# Patient Record
Sex: Male | Born: 1995 | Race: Black or African American | Hispanic: No | Marital: Single | Smoking: Never smoker
Health system: Southern US, Community
[De-identification: ages and names within clinical notes are randomized; demographics above are authoritative.]

## PROBLEM LIST (undated history)

## (undated) DIAGNOSIS — K219 Gastro-esophageal reflux disease without esophagitis: Secondary | ICD-10-CM

## (undated) DIAGNOSIS — A64 Unspecified sexually transmitted disease: Secondary | ICD-10-CM

## (undated) DIAGNOSIS — F909 Attention-deficit hyperactivity disorder, unspecified type: Secondary | ICD-10-CM

## (undated) DIAGNOSIS — F913 Oppositional defiant disorder: Secondary | ICD-10-CM

## (undated) DIAGNOSIS — F319 Bipolar disorder, unspecified: Secondary | ICD-10-CM

## (undated) DIAGNOSIS — J45909 Unspecified asthma, uncomplicated: Secondary | ICD-10-CM

## (undated) DIAGNOSIS — R4689 Other symptoms and signs involving appearance and behavior: Secondary | ICD-10-CM

## (undated) HISTORY — PX: KIDNEY SURGERY: SHX687

## (undated) HISTORY — DX: Gastro-esophageal reflux disease without esophagitis: K21.9

---
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
## 2016-02-16 ENCOUNTER — Other Ambulatory Visit (HOSPITAL_COMMUNITY)
Admission: RE | Admit: 2016-02-16 | Discharge: 2016-02-16 | Disposition: A | Discharge status: Home / Self Care | Payer: BLUE CROSS/BLUE SHIELD | Source: Ambulatory Visit | Attending: Family Medicine | Admitting: Family Medicine

## 2016-02-16 ENCOUNTER — Encounter: Payer: Self-pay | Admitting: Student in an Organized Health Care Education/Training Program

## 2016-02-16 ENCOUNTER — Ambulatory Visit (INDEPENDENT_AMBULATORY_CARE_PROVIDER_SITE_OTHER): Payer: BLUE CROSS/BLUE SHIELD | Admitting: Student in an Organized Health Care Education/Training Program

## 2016-02-16 VITALS — BP 110/80 | HR 105 | Temp 97.8°F | Wt 227.0 lb

## 2016-02-16 DIAGNOSIS — Z113 Encounter for screening for infections with a predominantly sexual mode of transmission: Secondary | ICD-10-CM | POA: Diagnosis not present

## 2016-02-16 DIAGNOSIS — F901 Attention-deficit hyperactivity disorder, predominantly hyperactive type: Secondary | ICD-10-CM

## 2016-02-16 MED ORDER — DEXMETHYLPHENIDATE HCL 10 MG PO TABS: 10.0000 mg | ORAL_TABLET | Freq: Every day | ORAL | 0 refills | Status: DC | PRN

## 2016-02-16 NOTE — Assessment & Plan Note (Signed)
-   ordered GC/Chlamydia, HIV, RPR - will f/u with results

## 2016-02-16 NOTE — Progress Notes (Signed)
   CC: ADHD refill, STI check  HPI: Caleb Bartlett is a 21 y.o. male with history of asthma, bipolar disorder, oppositional defiant disorder, and ADHD with hyperactivity who [facility] today for medication refill.  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   CC, SH/smoking status, and VS noted.  Objective: BP 110/80 (BP Location: Left Arm, Patient Position: Sitting, Cuff Size: Normal)   Pulse (!) 105   Temp 97.8 F (36.6 C) (Oral)   Wt 227 lb (103 kg)   SpO2 90%  GEN: NAD, alert, cooperative, and pleasant. RESPIRATORY: clear to auscultation bilaterally with no wheezes, rhonchi or rales, good effort CV: RRR, no m/r/g, no peripheral edema GI: soft, non-tender, non-distended, normoactive bowel sounds, no hepatosplenomegaly SKIN: warm and dry, no rashes or lesions NEURO: II-XII grossly intact, normal gait, peripheral sensation intact PSYCH: AAOx3, appropriate affect  Flu Vaccine: Declines  Assessment and plan:  Attention deficit hyperactivity disorder (ADHD) - Restarted dexmethylphenidate at 10 mg daily, reduced dose from 20 mg due to being off the medication x1 year - asked patient to make f/u appt for medication monitoring and refill in one month, discussed how controlled substances have to be refilled in the office and not over the phone - Patient may be able to stay at this dose for work, can titrate up as needed - continue to monitor for changes  in appetite, dizziness, insomnia/nightmares, mood lability, tics or psychosis, and vitals at each visit    Routine screening for STI (sexually transmitted infection) - ordered GC/Chlamydia, HIV, RPR - will f/u with results   Orders Placed This Encounter  Procedures  . HIV antibody  . RPR    Add on if possible  - Urine GC/Chla  Meds ordered this encounter  Medications  . dexmethylphenidate (FOCALIN) 10 MG tablet    Sig: Take 1 tablet (10 mg total) by mouth daily as needed.    Dispense:  30 tablet    Refill:  0     Caleb PouchLauren Jerred Zaremba, MD,MS,  PGY1 02/16/2016 5:27 PM

## 2016-02-16 NOTE — Patient Instructions (Signed)
It was a pleasure seeing you today in our clinic. Today we discussed ADHD and STI screening. Here is the treatment plan we have discussed and agreed upon together:  - We will contact you with the results of your STI testing. - Please follow up in one month for ADHD monitoring and medication refill.  Our clinic's number is [phone]. Please call with questions or concerns about what we discussed today.  Be well, Dr. Mosetta PuttFeng

## 2016-02-16 NOTE — Assessment & Plan Note (Addendum)
-   Restarted dexmethylphenidate at 10 mg daily, reduced dose from 20 mg due to being off the medication x1 year - asked patient to make f/u appt for medication monitoring and refill in one month, discussed how controlled substances have to be refilled in the office and not over the phone - Patient may be able to stay at this dose for work, can titrate up as needed - continue to monitor for changes in appetite, dizziness, insomnia/nightmares, mood lability, tics or psychosis, and vitals at each visit

## 2016-02-17 LAB — HIV ANTIBODY (ROUTINE TESTING W REFLEX): HIV 1&2 Ab, 4th Generation: NONREACTIVE

## 2016-02-22 LAB — URINE CYTOLOGY ANCILLARY ONLY
CHLAMYDIA, DNA PROBE: NEGATIVE
NEISSERIA GONORRHEA: NEGATIVE

## 2016-02-23 ENCOUNTER — Telehealth: Payer: Self-pay | Admitting: Student in an Organized Health Care Education/Training Program

## 2016-02-23 ENCOUNTER — Encounter: Payer: Self-pay | Admitting: Student in an Organized Health Care Education/Training Program

## 2016-02-23 NOTE — Telephone Encounter (Signed)
Called to inform patients of STI results - HIV, GC/Chla all negative.  Patient voiced understanding.

## 2016-03-21 NOTE — Progress Notes (Deleted)
   CC: ***  HPI: Caleb Bartlett is a 21 y.o. male with PMH significant for *** [facility] *** of *** duration.   - ***changes in appetite -***dizziness -***insomnia/nightmares -***mood lability -***tics or psychosis      Overdue health maintenance ***  Review of Symptoms:  See HPI for ROS.   CC, SH/smoking status, and VS noted.  Objective: There were no vitals taken for this visit. GEN: NAD, alert, cooperative, and pleasant.*** EYE: no conjunctival injection, pupils equally round and reactive to light ENMT: normal tympanic light reflex, no nasal polyps,no rhinorrhea, no pharyngeal erythema or exudates NECK: full ROM, no thyromegaly RESPIRATORY: clear to auscultation bilaterally with no wheezes, rhonchi or rales, good effort CV: RRR, no m/r/g, no peripheral edema GI: soft, non-tender, non-distended, normoactive bowel sounds, no hepatosplenomegaly SKIN: warm and dry, no rashes or lesions NEURO: II-XII grossly intact, normal gait, peripheral sensation intact PSYCH: AAOx3, appropriate affect  Flu Vaccine: *** Tdap Vaccine: *** - every 652yrs - (<3 lifetime doses or unknown): all wounds -- look up need for Tetanus IG - (>=3 lifetime doses): clean/minor wound if >652yrs from previous; all other wounds if >7926yrs from previous Zoster Vaccine: *** (those >50yo, once) Pneumonia Vaccine: *** (those w/ risk factors) - (<8522yr) Both: Immunocompromised, cochlear implant, CSF leak, asplenic, sickle cell, Chronic Renal Failure - (<3922yr) PPSV-23 only: Heart dz, lung disease, DM, tobacco abuse, alcoholism, cirrhosis/liver disease. - (>6222yr): PPSV13 then PPSV23 in 6-12mths;  - (>5122yr): repeat PPSV23 once if pt received prior to 20yo and 126yrs have passed  Assessment and plan:  No problem-specific Assessment & Plan notes found for this encounter.   No orders of the defined types were placed in this encounter.   No orders of the defined types were placed in  this encounter.    Howard PouchLauren Sladen Plancarte, MD,MS,  PGY1 03/21/2016 10:22 PM

## 2016-03-22 ENCOUNTER — Ambulatory Visit: Payer: BLUE CROSS/BLUE SHIELD | Admitting: Student in an Organized Health Care Education/Training Program

## 2016-10-04 ENCOUNTER — Encounter: Payer: BLUE CROSS/BLUE SHIELD | Admitting: Student in an Organized Health Care Education/Training Program

## 2016-10-04 NOTE — Progress Notes (Deleted)
   CC: ***  HPI: Claudette HeadJeremiah K Brown-Derr is a 21 y.o. male with PMH significant for *** who [facility] with *** of *** duration.   Overdue health maintenance ***  Review of Symptoms:  See HPI for ROS.   CC, SH/smoking status, and VS noted.  Objective: There were no vitals taken for this visit. GEN: NAD, alert, cooperative, and pleasant.*** EYE: no conjunctival injection, pupils equally round and reactive to light ENMT: normal tympanic light reflex, no nasal polyps,no rhinorrhea, no pharyngeal erythema or exudates NECK: full ROM, no thyromegaly RESPIRATORY: clear to auscultation bilaterally with no wheezes, rhonchi or rales, good effort CV: RRR, no m/r/g, no peripheral edema GI: soft, non-tender, non-distended, no hepatosplenomegaly SKIN: warm and dry, no rashes or lesions NEURO: II-XII grossly intact, normal gait, peripheral sensation intact PSYCH: AAOx3, appropriate affect  Flu Vaccine: *** Tdap Vaccine: *** - every 3648yrs - (<3 lifetime doses or unknown): all wounds -- look up need for Tetanus IG - (>=3 lifetime doses): clean/minor wound if >6848yrs from previous; all other wounds if >1877yrs from previous Zoster Vaccine: *** (those >50yo, once) Pneumonia Vaccine: *** (those w/ risk factors) - (<1654yr) Both: Immunocompromised, cochlear implant, CSF leak, asplenic, sickle cell, Chronic Renal Failure - (<4054yr) PPSV-23 only: Heart dz, lung disease, DM, tobacco abuse, alcoholism, cirrhosis/liver disease. - (>5454yr): PPSV13 then PPSV23 in 6-12mths;  - (>6254yr): repeat PPSV23 once if pt received prior to 20yo and 4677yrs have passed  Assessment and plan:  No problem-specific Assessment & Plan notes found for this encounter.   No orders of the defined types were placed in this encounter.   No orders of the defined types were placed in this encounter.    Howard PouchLauren Talbert Trembath, MD,MS,  PGY2 10/04/2016 6:06 AM

## 2016-10-27 ENCOUNTER — Ambulatory Visit (HOSPITAL_COMMUNITY)
Admission: EM | Admit: 2016-10-27 | Discharge: 2016-10-27 | Disposition: A | Discharge status: Home / Self Care | Payer: Self-pay | Attending: Emergency Medicine | Admitting: Emergency Medicine

## 2016-10-27 ENCOUNTER — Encounter (HOSPITAL_COMMUNITY): Payer: Self-pay | Admitting: Emergency Medicine

## 2016-10-27 DIAGNOSIS — R51 Headache: Secondary | ICD-10-CM

## 2016-10-27 DIAGNOSIS — G501 Atypical facial pain: Secondary | ICD-10-CM

## 2016-10-27 DIAGNOSIS — R519 Headache, unspecified: Secondary | ICD-10-CM
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
## 2016-10-27 NOTE — Discharge Instructions (Signed)
It appears you have maybe bit the inner buccal area to rt cheek no bleeding no breaking of the skin.  Use tylenol or motrin minimal for pain .  Use heat or ice as needed If you have any blurred vision or headaches go to the er for a x ray or scan

## 2016-11-04 ENCOUNTER — Encounter: Payer: BLUE CROSS/BLUE SHIELD | Admitting: Student in an Organized Health Care Education/Training Program

## 2017-02-03 ENCOUNTER — Other Ambulatory Visit: Payer: Self-pay

## 2017-02-03 ENCOUNTER — Encounter: Payer: Self-pay | Admitting: Student in an Organized Health Care Education/Training Program

## 2017-02-03 ENCOUNTER — Ambulatory Visit (INDEPENDENT_AMBULATORY_CARE_PROVIDER_SITE_OTHER): Payer: Self-pay | Admitting: Student in an Organized Health Care Education/Training Program

## 2017-02-03 VITALS — BP 110/82 | HR 89 | Temp 98.1°F | Ht 68.0 in | Wt 232.6 lb

## 2017-02-03 DIAGNOSIS — Z113 Encounter for screening for infections with a predominantly sexual mode of transmission: Secondary | ICD-10-CM

## 2017-02-03 DIAGNOSIS — F909 Attention-deficit hyperactivity disorder, unspecified type: Secondary | ICD-10-CM

## 2017-02-03 MED ORDER — DEXMETHYLPHENIDATE HCL 10 MG PO TABS: 10.0000 mg | ORAL_TABLET | Freq: Every day | ORAL | 0 refills | Status: DC | PRN

## 2017-02-03 NOTE — Patient Instructions (Signed)
It was a pleasure seeing you today in our clinic. Today we discussed STI screening and ADHD. Here is the treatment plan we have discussed and agreed upon together:  Please schedule a visit for one month from now to refill your ADHD medications. I will not be able to refill them unless you follow up, because this is a controlled medication that must be monitored.  We drew blood work at today's visit. I will call or send you a letter with these results. If you do not hear from me within the next week, please give our office a call.  You can come back anytime for a lab visit for us to collect urine.  Our clinic's number is [phone]. Please call with questions or concerns about what we discussed today.  Be well, Dr. Mosetta PuttFeng

## 2017-02-03 NOTE — Progress Notes (Signed)
   CC: ADHD, STI screen  HPI: Caleb Bartlett is a 22 y.o. male with PMH significant for ADHD, bipolar disorder,oppositional defiant disorder, asthma who [facility] or ADHD follow-up and STI check.    ADHD Patient was seen about a year ago at which time he stated he was starting a new job and would like a refill on ADHD medications, Focalin 10 mg which he had been on previously, but had been off of for a while. Patient states he is now starting college classes and is concerned about ADHD interfering with his ability to focus with schoolwork. He states that he was able to focus better for that month that he had focalin last year. Upon chart review I can see that the patient has been treated for ADHD since before the age of 22.   STI screen - patient requests routine STI screening - He reports that he is sexually active and uses condoms every time for protection -He denies noticing any symptoms, no rashes or lesions, no urinary symptoms such as dysuria, urinary frequency, urinary urgency. No GI symptoms such as constipation or diarrhea.  Review of Symptoms:  See HPI for ROS.   CC, SH/smoking status, and VS noted.  Objective: BP 110/82   Pulse 89   Temp 98.1 F (36.7 C) (Oral)   Ht 5\' 8"  (1.727 m)   Wt 232 lb 9.6 oz (105.5 kg)   SpO2 98%   BMI 35.37 kg/m  GEN: NAD, alert, cooperative, and pleasant. RESPIRATORY: clear to auscultation bilaterally with no wheezes, rhonchi or rales, good effort CV: RRR, no m/r/g GI: soft, non-tender, non-distended SKIN: warm and dry, no rashes or lesions NEURO: II-XII grossly intact, normal gait PSYCH: AAOx3, appropriate affect  Assessment and plan:  Attention deficit hyperactivity disorder (ADHD) Discussed necessity of follow up and monitoring for controlled substances. Patient voices understanding. It is reasonable to refill focalin for him to start college - follow up in one month - patient understands, no refills without follow  up  Routine screening for STI (sexually transmitted infection) Patient requests STI screening. Discussed safe sex practices. - HIV, RPR today - unable to collect urine GC/Chla due to patient urinating right before the visit, however we can do at next visit or as lab visit. He is asymptomatic and it is routine screening   Orders Placed This Encounter  Procedures  . RPR  . HIV antibody    Meds ordered this encounter  Medications  . dexmethylphenidate (FOCALIN) 10 MG tablet    Sig: Take 1 tablet (10 mg total) by mouth daily as needed.    Dispense:  30 tablet    Refill:  0   Howard PouchLauren Demia Viera, MD,MS,  PGY2 02/08/2017 7:26 AM

## 2017-02-04 ENCOUNTER — Encounter (HOSPITAL_BASED_OUTPATIENT_CLINIC_OR_DEPARTMENT_OTHER): Payer: Self-pay | Admitting: Student in an Organized Health Care Education/Training Program

## 2017-02-04 LAB — RPR: RPR Ser Ql: NONREACTIVE

## 2017-02-04 LAB — HIV ANTIBODY (ROUTINE TESTING W REFLEX): HIV Screen 4th Generation wRfx: NONREACTIVE

## 2017-02-07 ENCOUNTER — Encounter: Payer: Self-pay | Admitting: Student in an Organized Health Care Education/Training Program

## 2017-02-08 ENCOUNTER — Encounter: Payer: Self-pay | Admitting: Student in an Organized Health Care Education/Training Program

## 2017-02-08 NOTE — Assessment & Plan Note (Signed)
Discussed necessity of follow up and monitoring for controlled substances. Patient voices understanding. It is reasonable to refill focalin for him to start college - follow up in one month - patient understands, no refills without follow up

## 2017-02-08 NOTE — Assessment & Plan Note (Addendum)
Patient requests STI screening. Discussed safe sex practices. - HIV, RPR today - unable to collect urine GC/Chla due to patient urinating right before the visit, however we can do at next visit or as lab visit. He is asymptomatic and it is routine screening

## 2017-04-19 ENCOUNTER — Encounter: Payer: Self-pay | Admitting: Student in an Organized Health Care Education/Training Program

## 2017-04-19 ENCOUNTER — Ambulatory Visit: Payer: Managed Care, Other (non HMO) | Admitting: Student in an Organized Health Care Education/Training Program

## 2017-04-19 ENCOUNTER — Other Ambulatory Visit: Payer: Self-pay

## 2017-04-19 ENCOUNTER — Other Ambulatory Visit (HOSPITAL_COMMUNITY)
Admission: RE | Admit: 2017-04-19 | Discharge: 2017-04-19 | Disposition: A | Discharge status: Home / Self Care | Payer: Managed Care, Other (non HMO) | Source: Ambulatory Visit | Attending: Family Medicine | Admitting: Family Medicine

## 2017-04-19 VITALS — BP 110/66 | HR 91 | Temp 97.3°F | Wt 231.0 lb

## 2017-04-19 DIAGNOSIS — Z113 Encounter for screening for infections with a predominantly sexual mode of transmission: Secondary | ICD-10-CM

## 2017-04-19 DIAGNOSIS — F319 Bipolar disorder, unspecified: Secondary | ICD-10-CM | POA: Diagnosis not present

## 2017-04-19 DIAGNOSIS — F909 Attention-deficit hyperactivity disorder, unspecified type: Secondary | ICD-10-CM | POA: Diagnosis not present

## 2017-04-19 NOTE — Assessment & Plan Note (Signed)
Patient denies having new sexual partners but does ask for "all" STI screening.  - HIV, RPR - GC/Chla - will follow up with results - safe sexual practices reviewed

## 2017-04-19 NOTE — Progress Notes (Signed)
   CC: ADHD follow up, STI  HPI: Caleb Bartlett is a 22 y.o. male with PMH significant for ADHD, bipolar, oppositional defiant disorder [facility].  ADHD/bipolar Feels he is too jittery, too high energy. Doing well in school, has 2 B's, one C, and an A. He has been out of focalin since before his last visit on 02/03/2017, was unable to fill it due to insurance not covering this medication. He reports that he has also been out of Abilify since last year. He is working at [employer] and doing well in his job. He reports that he felt an anger flare coming yesterday at work and had to calm himself down to prevent from lashing out. He asks for referral to psychiatry for possible Abilify refill.   STI screening No new sexual partners. No sores or lesions. No pain with urination.  Review of Symptoms:  See HPI for ROS.   CC, SH/smoking status, and VS noted.  Objective: BP 110/66   Pulse 91   Temp (!) 97.3 F (36.3 C) (Oral)   Wt 231 lb (104.8 kg)   SpO2 97%   BMI 35.12 kg/m  GEN: NAD, alert, cooperative, and pleasant. RESPIRATORY: comfortable work of breathing CV: RRR,no peripheral edema GI: soft, non-tender, non-distended, no hepatosplenomegaly PSYCH: AAOx3, appropriate affect  Assessment and plan:  Routine screening for STI (sexually transmitted infection) Patient denies having new sexual partners but does ask for "all" STI screening.  - HIV, RPR - GC/Chla - will follow up with results - safe sexual practices reviewed  BIPOLAR DISORDER UNSPECIFIED Patient has good insight into his mood, and is able to use calming strategies to keep from lashing out when he becomes angry. He does have some increased energy, jitteriness. No SI/HI. He was given a list of psychiatrists in the area and asked to coordinate through his insurance to find one that is covered. He can call and make an appointment.  Also gave him information regarding [facility]. He may be a  good candidate for CBT/strategies to manage his symptoms if he prefers to be off of medications, given his insight and ability to manage his mood disorder off of medications over the last year.  Attention deficit hyperactivity disorder (ADHD) Patient is functioning well. He has good grades and is doing well at work without ADHD Meds. Shared decision making, we will not order ADHD meds at this time and have him follow up with psychiatry for bipolar evaluation. It may be that with bipolar under control he does not require ADHD meds at all.  Down the road if he does need to go back on ADHD meds, note that focalin was not covered by insurance and he has tolerated Aderall in the past.   Orders Placed This Encounter  Procedures  . HIV antibody  . RPR    Howard PouchLauren Amylia Collazos, MD,MS,  PGY2 04/19/2017 4:41 PM

## 2017-04-19 NOTE — Patient Instructions (Signed)
It was a pleasure seeing you today in our clinic. Here is the treatment plan we have discussed and agreed upon together:  We drew blood work at today's visit. I will call or send you a letter with these results. If you do not hear from me within the next week, please give our office a call.  Our clinic's number is [phone]. Please call with questions or concerns about what we discussed today.  Be well, Dr. Mosetta PuttFeng  Sign up for My Chart to have easy access to your labs results, and communication with your primary care physician.   This office Provides Integrated Care That means we have Behavioral Health Consultants [facility](BHCs) who work with your doctors to help with  . Habits,  . Behaviors, or  . Emotions  that get in the way of your life or health. These consultants help target specific goals or problems that you want to work on.   [facility] services at [facility] are free  Some examples of things Consultants help with are: . Stress                                   . Managing chronic problems (diabetes, chronic pain, asthma, etc) . Parenting concerns  . Referral to community resources . Substance use    . Improving diet and exercise habits . Sleep problems    . Family/Relationship problems . Grief     . Mental health concerns   . Lifestyle changes  . Taking medications as prescribed    (call the office [phone]) to schedule an appointment

## 2017-04-19 NOTE — Assessment & Plan Note (Signed)
Patient has good insight into his mood, and is able to use calming strategies to keep from lashing out when he becomes angry. He does have some increased energy, jitteriness. No SI/HI. He was given a list of psychiatrists in the area and asked to coordinate through his insurance to find one that is covered. He can call and make an appointment.  Also gave him information regarding our [facility]. He may be a good candidate for CBT/strategies to manage his symptoms if he prefers to be off of medications, given his insight and ability to manage his mood disorder off of medications over the last year.

## 2017-04-19 NOTE — Assessment & Plan Note (Signed)
Patient is functioning well. He has good grades and is doing well at work without ADHD Meds. Shared decision making, we will not order ADHD meds at this time and have him follow up with psychiatry for bipolar evaluation. It may be that with bipolar under control he does not require ADHD meds at all.  Down the road if he does need to go back on ADHD meds, note that focalin was not covered by insurance and he has tolerated Aderall in the past.

## 2017-04-20 LAB — URINE CYTOLOGY ANCILLARY ONLY
CHLAMYDIA, DNA PROBE: NEGATIVE
Neisseria Gonorrhea: NEGATIVE
Trichomonas: NEGATIVE

## 2017-04-20 LAB — RPR: RPR Ser Ql: NONREACTIVE

## 2017-04-20 LAB — HIV ANTIBODY (ROUTINE TESTING W REFLEX): HIV Screen 4th Generation wRfx: NONREACTIVE

## 2017-05-27 ENCOUNTER — Other Ambulatory Visit: Payer: Self-pay

## 2017-05-27 ENCOUNTER — Encounter (HOSPITAL_COMMUNITY): Payer: Self-pay

## 2017-05-27 ENCOUNTER — Emergency Department (HOSPITAL_COMMUNITY)
Admission: EM | Admit: 2017-05-27 | Discharge: 2017-05-28 | Disposition: A | Discharge status: Home / Self Care | Payer: Managed Care, Other (non HMO) | Attending: Emergency Medicine | Admitting: Emergency Medicine

## 2017-05-27 DIAGNOSIS — K920 Hematemesis: Secondary | ICD-10-CM | POA: Diagnosis present

## 2017-05-27 DIAGNOSIS — Z5321 Procedure and treatment not carried out due to patient leaving prior to being seen by health care provider: Secondary | ICD-10-CM | POA: Diagnosis not present

## 2017-05-27 DIAGNOSIS — R109 Unspecified abdominal pain: Secondary | ICD-10-CM | POA: Insufficient documentation

## 2017-05-27 LAB — URINALYSIS, ROUTINE W REFLEX MICROSCOPIC
Bacteria, UA: NONE SEEN
Bilirubin Urine: NEGATIVE
GLUCOSE, UA: NEGATIVE mg/dL
HGB URINE DIPSTICK: NEGATIVE
Ketones, ur: 5 mg/dL — AB
NITRITE: NEGATIVE
PH: 6 (ref 5.0–8.0)
Protein, ur: NEGATIVE mg/dL
SPECIFIC GRAVITY, URINE: 1.027 (ref 1.005–1.030)

## 2017-05-27 LAB — COMPREHENSIVE METABOLIC PANEL
ALK PHOS: 81 U/L (ref 38–126)
ALT: 16 U/L — AB (ref 17–63)
ANION GAP: 8 (ref 5–15)
AST: 25 U/L (ref 15–41)
Albumin: 4.1 g/dL (ref 3.5–5.0)
BILIRUBIN TOTAL: 1.1 mg/dL (ref 0.3–1.2)
BUN: 11 mg/dL (ref 6–20)
CALCIUM: 9.1 mg/dL (ref 8.9–10.3)
CO2: 26 mmol/L (ref 22–32)
CREATININE: 1.19 mg/dL (ref 0.61–1.24)
Chloride: 106 mmol/L (ref 101–111)
GFR calc Af Amer: >60 mL/min (ref >60)
Glucose, Bld: 99 mg/dL (ref 65–99)
Potassium: 4 mmol/L (ref 3.5–5.1)
Sodium: 140 mmol/L (ref 135–145)
TOTAL PROTEIN: 6.7 g/dL (ref 6.5–8.1)

## 2017-05-27 LAB — CBC
HCT: 44 % (ref 39.0–52.0)
Hemoglobin: 14.7 g/dL (ref 13.0–17.0)
MCH: 26.6 pg (ref 26.0–34.0)
MCHC: 33.4 g/dL (ref 30.0–36.0)
MCV: 79.7 fL (ref 78.0–100.0)
PLATELETS: 235 10*3/uL (ref 150–400)
RBC: 5.52 MIL/uL (ref 4.22–5.81)
RDW: 13.6 % (ref 11.5–15.5)
WBC: 5.2 10*3/uL (ref 4.0–10.5)

## 2017-05-27 LAB — LIPASE, BLOOD: Lipase: 25 U/L (ref 11–51)

## 2017-05-27 NOTE — ED Triage Notes (Signed)
Pt states that he vomited one time before work after eating and that it had blood in it, abd pain prior to vomiting but has resolved now. Denies diarrhea and fevers.

## 2017-05-28 NOTE — ED Notes (Signed)
Pt called for vitals. No answer.  

## 2017-06-15 ENCOUNTER — Encounter (HOSPITAL_COMMUNITY): Payer: Self-pay

## 2017-06-15 ENCOUNTER — Encounter (HOSPITAL_COMMUNITY): Payer: Self-pay | Admitting: *Deleted

## 2017-06-15 ENCOUNTER — Inpatient Hospital Stay (HOSPITAL_COMMUNITY)
Admission: AD | Admit: 2017-06-15 | Discharge: 2017-06-19 | DRG: 885 | Disposition: A | Discharge status: Home / Self Care | Payer: 59 | Source: Intra-hospital | Attending: Psychiatry | Admitting: Psychiatry

## 2017-06-15 ENCOUNTER — Other Ambulatory Visit: Payer: Self-pay

## 2017-06-15 ENCOUNTER — Emergency Department (HOSPITAL_COMMUNITY)
Admission: EM | Admit: 2017-06-15 | Discharge: 2017-06-15 | Disposition: A | Discharge status: Other Acute Inpatient Hospital | Payer: Managed Care, Other (non HMO) | Attending: Emergency Medicine | Admitting: Emergency Medicine

## 2017-06-15 DIAGNOSIS — F332 Major depressive disorder, recurrent severe without psychotic features: Secondary | ICD-10-CM | POA: Diagnosis present

## 2017-06-15 DIAGNOSIS — J45909 Unspecified asthma, uncomplicated: Secondary | ICD-10-CM | POA: Diagnosis present

## 2017-06-15 DIAGNOSIS — R45851 Suicidal ideations: Secondary | ICD-10-CM | POA: Diagnosis present

## 2017-06-15 DIAGNOSIS — Z818 Family history of other mental and behavioral disorders: Secondary | ICD-10-CM

## 2017-06-15 DIAGNOSIS — F419 Anxiety disorder, unspecified: Secondary | ICD-10-CM | POA: Diagnosis not present

## 2017-06-15 DIAGNOSIS — F4001 Agoraphobia with panic disorder: Secondary | ICD-10-CM | POA: Diagnosis not present

## 2017-06-15 DIAGNOSIS — F329 Major depressive disorder, single episode, unspecified: Secondary | ICD-10-CM

## 2017-06-15 DIAGNOSIS — F410 Panic disorder [episodic paroxysmal anxiety] without agoraphobia: Secondary | ICD-10-CM | POA: Diagnosis present

## 2017-06-15 DIAGNOSIS — Z79899 Other long term (current) drug therapy: Secondary | ICD-10-CM

## 2017-06-15 DIAGNOSIS — F32A Depression, unspecified: Secondary | ICD-10-CM

## 2017-06-15 DIAGNOSIS — Z7951 Long term (current) use of inhaled steroids: Secondary | ICD-10-CM

## 2017-06-15 DIAGNOSIS — F4000 Agoraphobia, unspecified: Secondary | ICD-10-CM | POA: Diagnosis present

## 2017-06-15 DIAGNOSIS — Z915 Personal history of self-harm: Secondary | ICD-10-CM | POA: Diagnosis not present

## 2017-06-15 HISTORY — DX: Unspecified asthma, uncomplicated: J45.909

## 2017-06-15 LAB — SALICYLATE LEVEL: Salicylate Lvl: <7 mg/dL (ref 2.8–30.0)

## 2017-06-15 LAB — RAPID URINE DRUG SCREEN, HOSP PERFORMED
Amphetamines: NOT DETECTED
Barbiturates: NOT DETECTED
Benzodiazepines: NOT DETECTED
COCAINE: NOT DETECTED
Opiates: NOT DETECTED
TETRAHYDROCANNABINOL: POSITIVE — AB

## 2017-06-15 LAB — COMPREHENSIVE METABOLIC PANEL
ALBUMIN: 4.4 g/dL (ref 3.5–5.0)
ALK PHOS: 85 U/L (ref 38–126)
ALT: 33 U/L (ref 17–63)
ANION GAP: 10 (ref 5–15)
AST: 25 U/L (ref 15–41)
BUN: 14 mg/dL (ref 6–20)
CALCIUM: 9.7 mg/dL (ref 8.9–10.3)
CO2: 23 mmol/L (ref 22–32)
Chloride: 106 mmol/L (ref 101–111)
Creatinine, Ser: 0.99 mg/dL (ref 0.61–1.24)
GFR calc non Af Amer: >60 mL/min (ref >60)
Glucose, Bld: 94 mg/dL (ref 65–99)
POTASSIUM: 3.6 mmol/L (ref 3.5–5.1)
SODIUM: 139 mmol/L (ref 135–145)
TOTAL PROTEIN: 7.9 g/dL (ref 6.5–8.1)
Total Bilirubin: 0.8 mg/dL (ref 0.3–1.2)

## 2017-06-15 LAB — CBC
HCT: 48.6 % (ref 39.0–52.0)
HEMOGLOBIN: 16.5 g/dL (ref 13.0–17.0)
MCH: 27 pg (ref 26.0–34.0)
MCHC: 34 g/dL (ref 30.0–36.0)
MCV: 79.5 fL (ref 78.0–100.0)
Platelets: 251 10*3/uL (ref 150–400)
RBC: 6.11 MIL/uL — ABNORMAL HIGH (ref 4.22–5.81)
RDW: 13.7 % (ref 11.5–15.5)
WBC: 4.5 10*3/uL (ref 4.0–10.5)

## 2017-06-15 LAB — ETHANOL: Alcohol, Ethyl (B): <10 mg/dL (ref <10)

## 2017-06-15 LAB — ACETAMINOPHEN LEVEL

## 2017-06-15 MED ORDER — ACETAMINOPHEN 325 MG PO TABS: 650.0000 mg | ORAL_TABLET | Freq: Four times a day (QID) | ORAL | Status: DC | PRN

## 2017-06-15 MED ORDER — ARIPIPRAZOLE 5 MG PO TABS: 5.0000 mg | ORAL_TABLET | Freq: Two times a day (BID) | ORAL | Status: DC

## 2017-06-15 MED ORDER — ALUM & MAG HYDROXIDE-SIMETH 200-200-20 MG/5ML PO SUSP: 30.0000 mL | ORAL | Status: DC | PRN

## 2017-06-15 MED ORDER — HYDROXYZINE HCL 25 MG PO TABS: 25.0000 mg | ORAL_TABLET | Freq: Three times a day (TID) | ORAL | Status: DC | PRN

## 2017-06-15 MED ORDER — ALBUTEROL SULFATE HFA 108 (90 BASE) MCG/ACT IN AERS: 2.0000 | INHALATION_SPRAY | RESPIRATORY_TRACT | Status: DC | PRN

## 2017-06-15 MED ORDER — MAGNESIUM HYDROXIDE 400 MG/5ML PO SUSP: 30.0000 mL | Freq: Every day | ORAL | Status: DC | PRN

## 2017-06-15 MED ORDER — TRAZODONE HCL 50 MG PO TABS: 50.0000 mg | ORAL_TABLET | Freq: Every evening | ORAL | Status: DC | PRN

## 2017-06-15 NOTE — ED Notes (Signed)
Patient transported to [facility] via [employer]. Patient pleasant and cooperative with care; no distress noted at this time. Belongings given to transport service.

## 2017-06-15 NOTE — Progress Notes (Signed)
Caleb Bartlett is a 22 year old male being admitted voluntarily to [facility] from [facility].  He was brought to the ED via [employer] after they found him standing on a bridge considering jumping.  During [facility] admission, he was pleasant and cooperative.  He denies current SI/HI or A/V hallucinations.  He reported long history of depression and suicidal ideation that "comes in spells."  He denies any current stressors at this time.  He denies any pain or discomfort and appears to be in no apparent distress.  Oriented him to the unit.  Admission paperwork completed and signed.  Belongings searched and secured in locker # 26, no contraband found.  Skin assessment completed and no skin issues noted.  Q 15 minute checks initiated for safety.  We will continue to monitor the progress towards his goals.

## 2017-06-15 NOTE — ED Notes (Signed)
Per admission nurse, Herbert Seta, pt able to come after 9pm.

## 2017-06-15 NOTE — ED Provider Notes (Signed)
  Physical Exam  BP 122/82 (BP Location: Left Arm)   Pulse 60   Temp 98 F (36.7 C) (Oral)   Resp 18   SpO2 100%   Physical Exam  ED Course/Procedures     Procedures  MDM  Accepted at behavioral health.       Benjiman Core, MD 06/15/17 2137

## 2017-06-15 NOTE — ED Triage Notes (Signed)
Pt brought in by [employer] for suicide attempt, pt was trying to jump off a bridge. Pt agreed to come voluntarily.

## 2017-06-15 NOTE — ED Provider Notes (Signed)
[facility]-EMERGENCY DEPT Provider Note   CSN: [id number] Arrival date & time: 06/15/17  1216     History   Chief Complaint Chief Complaint  Patient presents with  . Suicidal    HPI Caleb Bartlett is a 22 y.o. male.  The history is provided by the patient. No language interpreter was used.  Mental Health Problem  Presenting symptoms: suicidal thoughts   Degree of incapacity (severity):  Moderate Onset quality:  Gradual Timing:  Constant Progression:  Worsening Chronicity:  New Treatment compliance:  Untreated Relieved by:  Nothing Worsened by:  Nothing Associated symptoms: feelings of worthlessness   Risk factors: no family hx of mental illness    Pt reports he has had problems with depression since he was 17.  Pt reports he had a suicide attempt at 6.  Pt reports he had some counseling in the past.  Past Medical History:  Diagnosis Date  . GERD (gastroesophageal reflux disease)     Patient Active Problem List   Diagnosis Date Noted  . Routine screening for STI (sexually transmitted infection) 02/16/2016  . Dysphagia, pharyngoesophageal phase 02/08/2010  . BIPOLAR DISORDER UNSPECIFIED 09/17/2008  . OPPOSITIONAL DEFIANT DISORDER 04/01/2008  . 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 Attention deficit hyperactivity disorder (ADHD) 03/30/2006  . RHINITIS, ALLERGIC 03/30/2006  . ASTHMA, PERSISTENT 03/30/2006    Past Surgical History:  Procedure Laterality Date  . KIDNEY SURGERY          Home Medications    Prior to Admission medications   Medication Sig Start Date End Date Taking? Authorizing Provider  beclomethasone (QVAR) 40 MCG/ACT inhaler Inhale 3 puffs into the lungs daily as needed (shortness of breath).    Yes [provider]  albuterol (PROVENTIL) 90 MCG/ACT inhaler Inhale 2 puffs into the lungs every 4 (four) hours as needed.      [provider]  ARIPiprazole (ABILIFY) 5 MG tablet Take 1  tablet (5 mg total) by mouth daily. Patient taking differently: Take 5 mg by mouth 2 (two) times daily.  12/03/12   Andrena Mews, DO  dexmethylphenidate (FOCALIN) 10 MG tablet Take 1 tablet (10 mg total) by mouth daily as needed. Patient taking differently: Take 10 mg by mouth daily.  02/03/17   Howard Pouch, MD    Family History No family history on file.  Social History Social History   Tobacco Use  . Smoking status: Never Smoker  . Smokeless tobacco: Never Used  Substance Use Topics  . Alcohol use: No  . Drug use: No     Allergies   Patient has no known allergies.   Review of Systems Review of Systems  Psychiatric/Behavioral: Positive for suicidal ideas.  All other systems reviewed and are negative.    Physical Exam Updated Vital Signs BP 120/84 (BP Location: Right Arm)   Pulse 96   Temp 97.9 F (36.6 C) (Oral)   Resp 18   SpO2 95%   Physical Exam  Constitutional: He is oriented to person, place, and time. He appears well-developed and well-nourished.  HENT:  Head: Normocephalic.  Eyes: Pupils are equal, round, and reactive to light.  Cardiovascular: Normal rate.  Pulmonary/Chest: Effort normal.  Abdominal: Soft.  Musculoskeletal: Normal range of motion.  Neurological: He is alert and oriented to person, place, and time.  Skin: Skin is warm and dry.  Psychiatric: He has a normal mood and affect.  Nursing note and vitals reviewed.    ED Treatments /  Results  Labs (all labs ordered are listed, but only abnormal results are displayed) Labs Reviewed  ACETAMINOPHEN LEVEL - Abnormal; Notable for the following components:      Result Value   Acetaminophen (Tylenol), Serum <10 (*)    All other components within normal limits  CBC - Abnormal; Notable for the following components:   RBC 6.11 (*)    All other components within normal limits  RAPID URINE DRUG SCREEN, HOSP PERFORMED - Abnormal; Notable for the following components:   Tetrahydrocannabinol  POSITIVE (*)    All other components within normal limits  COMPREHENSIVE METABOLIC PANEL  ETHANOL  SALICYLATE LEVEL    EKG None  Radiology No results found.  Procedures Procedures (including critical care time)  Medications Ordered in ED Medications - No data to display   Initial Impression / Assessment and Plan / ED Course  I have reviewed the triage vital signs and the nursing notes.  Pertinent labs & imaging results that were available during my care of the patient were reviewed by me and considered in my medical decision making (see chart for details).     Pt is medically clear for TTS evaluation   Final Clinical Impressions(s) / ED Diagnoses   Final diagnoses:  Suicidal ideation  Depression, unspecified depression type    ED Discharge Orders    None       Elson Areas, [place] 06/15/17 1417    Arby Barrette, MD 06/22/17 [id number]

## 2017-06-15 NOTE — ED Provider Notes (Signed)
Patient handed off to me by previous EDP at shift change pending TTS consult.  Please see previous note for full H&P.  Briefly, patient is a 22 year old male who presents for increased depression and suicidal ideation.  1950: TTS counselor evaluated patient.  Lord, NP, recommends inpatient hospitalization.  Home meds ordered.   Liberty Handy, PA-C 06/15/17 Woodroe Mode, MD 06/19/17 [phone]

## 2017-06-15 NOTE — ED Notes (Signed)
Caleb Bartlett says he's not suicidal right now but it comes and goes. He says he wishes he hadn't come to the hospital and had handled his problems on his own. He isn't taking any medication currently. Sometimes he has thoughts of harming his brothers "a little," who he says have treated him "like an outcast." He denies AVH. He says he won't harm himself while here. [name] makes little eye contact and appears deeply depressed.

## 2017-06-15 NOTE — ED Notes (Signed)
Pt's mom is visiting. Release of information is signed and in chart. He has a med Science writer in June. She wishes he could get quicker appointments for both med mgmt and therapy. He has [employer].

## 2017-06-15 NOTE — ED Notes (Signed)
Bed: UJW11 Expected date:  Expected time:  Means of arrival:  Comments: Triage 7

## 2017-06-15 NOTE — BH Assessment (Addendum)
Assessment Note  Caleb Bartlett is a single 22 y.o. male who presents voluntarily reporting symptoms of depression and suicidal ideation. Pt was brought to ED via [employer] after pt was found standing on [address] bridge considering jumping to road below. Pt has a history of Depression, Bipolar d/o, ADHD, and ODD. He last received MH tx at [facility] over 5 years ago. He began tx there as a child. He has not had medication for over 5 years as well. He has no previous inpt MH tx. Pt reports depressed moods seem to quickly and hit him unpredictably. Pt reports one past attempt suicidal attempt at about age 52 when he wrapped a cord from blinds around his neck. Pt gave verbal authorization to get collateral information from pt's mother Caleb Bartlett [phone]). Mother reports concern for pt's safety. She states pt has been writing suicide notes & putting them under her door. In most recent note, pt stated "I don't want to live anymore. You've got 2 other sons". Mother has been trying to make an outpt appt for pt but has encountered long waits. She has an appt for June 5th, but feels it is a long time to wait with pt's depressed mood. Pt reported no access to firearms during assessment but later reported he has upcoming court date, July 16, for possession of firearms.  Pt denies HI, AVH & other psychotic symptoms.   Diagnosis: F33.2 Major depressive disorder, Recurrent episode, Severe  Disposition: Caleb Brunswick, NP, recommends inpt MH hospitalization     Past Medical History:  Past Medical History:  Diagnosis Date  . GERD (gastroesophageal reflux disease)     Past Surgical History:  Procedure Laterality Date  . KIDNEY SURGERY      Family History: No family history on file.  Social History:  reports that he has never smoked. He has never used smokeless tobacco. He reports that he does not drink alcohol or use drugs.  Additional Social History:  Alcohol / Drug Use Pain Medications: see  MAR Prescriptions: see MAR Over the Counter: see MAR History of alcohol / drug use?: Yes Substance #1 Name of Substance 1: THC 1 - Frequency: occasional  CIWA: CIWA-Ar BP: 122/82 Pulse Rate: 60 COWS:    Allergies: No Known Allergies  Home Medications:  (Not in a hospital admission)  OB/GYN Status:  No LMP for male patient.  General Assessment Data Location of Assessment: [facility] TTS Assessment: In system Is this a Tele or Face-to-Face Assessment?: Face-to-Face Is this an Initial Assessment or a Re-assessment for this encounter?: Initial Assessment Marital status: Single Living Arrangements: Parent Can pt return to current living arrangement?: Yes Admission Status: Voluntary Is patient capable of signing voluntary admission?: Yes Referral Source: Self/Family/Friend Insurance type: Medical sales representative     Crisis Care Plan Living Arrangements: Parent Name of Psychiatrist: none at present Name of Therapist: none at present  Education Status Is patient currently in school?: No Is the patient employed, unemployed or receiving disability?: Employed([employer])  Risk to self with the past 6 months Suicidal Ideation: No-Not Currently/Within Last 6 Months(most recently 2 pm today) Has patient been a risk to self within the past 6 months prior to admission? : Yes Suicidal Intent: No-Not Currently/Within Last 6 Months Has patient had any suicidal intent within the past 6 months prior to admission? : Yes Is patient at risk for suicide?: Yes Suicidal Plan?: No-Not Currently/Within Last 6 Months Has patient had any suicidal plan within the past 6 months prior to  admission? : Yes Access to Means: Yes Specify Access to Suicidal Means: pt found on bridge by [employer] What has been your use of drugs/alcohol within the last 12 months?: THC occasionally Previous Attempts/Gestures: Yes How many times?: 1(wrapped blind cord around neck at 22 yo) Triggers for Past Attempts: Unpredictable, Other  (Comment)(depressed mood) Recent stressful life event(s): (difficulty keeping job) Persecutory voices/beliefs?: No Depression: Yes Depression Symptoms: Despondent, Insomnia, Tearfulness, Isolating, Fatigue, Guilt, Loss of interest in usual pleasures, Feeling worthless/self pity, Feeling angry/irritable Substance abuse history and/or treatment for substance abuse?: Yes(occasional THC use)  Risk to Others within the past 6 months Homicidal Ideation: No Does patient have any lifetime risk of violence toward others beyond the six months prior to admission? : No Thoughts of Harm to Others: No Current Homicidal Intent: No Current Homicidal Plan: No Access to Homicidal Means: No History of harm to others?: No Assessment of Violence: None Noted Does patient have access to weapons?: No(denied, but then reported court date firearm possession) Criminal Charges Pending?: Yes Describe Pending Criminal Charges: (firearm possession) Does patient have a court date: Yes Court Date: 08/15/17 Is patient on probation?: No  Psychosis Hallucinations: None noted Delusions: None noted  Mental Status Report Appearance/Hygiene: In scrubs, Unremarkable Eye Contact: Fair Motor Activity: Freedom of movement Speech: Logical/coherent, Unremarkable Level of Consciousness: Alert Mood: Apprehensive, Pleasant Affect: Apprehensive, Blunted Anxiety Level: Minimal Judgement: Partial Orientation: Person, Place, Time, Situation Obsessive Compulsive Thoughts/Behaviors: Minimal  Cognitive Functioning Concentration: Decreased Memory: Recent Intact, Remote Intact Is patient DD?: No Insight: Fair Impulse Control: Good Appetite: Fair Have you had any weight changes? : Loss Sleep: Decreased Total Hours of Sleep: 5  ADLScreening [facility] Assessment Services) Patient's cognitive ability adequate to safely complete daily activities?: Yes Patient able to express need for assistance with ADLs?: Yes Independently  performs ADLs?: Yes (appropriate for developmental age)  Prior Inpatient Therapy Prior Inpatient Therapy: No  Prior Outpatient Therapy Prior Outpatient Therapy: Yes Prior Therapy Dates: (2015) Prior Therapy Facilty/Provider(s): [facility] Reason for Treatment: Bipolar, ODD Does patient have [facility] services? : Yes  ADL Screening (condition at time of admission) Patient's cognitive ability adequate to safely complete daily activities?: Yes Is the patient deaf or have difficulty hearing?: No Does the patient have difficulty seeing, even when wearing glasses/contacts?: No Does the patient have difficulty concentrating, remembering, or making decisions?: No Patient able to express need for assistance with ADLs?: Yes Does the patient have difficulty dressing or bathing?: No Independently performs ADLs?: Yes (appropriate for developmental age) Does the patient have difficulty walking or climbing stairs?: No Weakness of Legs: None Weakness of Arms/Hands: None  Home Assistive Devices/Equipment Home Assistive Devices/Equipment: None  Therapy Consults (therapy consults require a physician order) PT Evaluation Needed: No OT Evalulation Needed: No SLP Evaluation Needed: No Abuse/Neglect Assessment (Assessment to be complete while patient is alone) Abuse/Neglect Assessment Can Be Completed: Yes Physical Abuse: Denies Verbal Abuse: Denies Sexual Abuse: Denies Exploitation of patient/patient's resources: Denies Self-Neglect: Denies Values / Beliefs Cultural Requests During Hospitalization: None Spiritual Requests During Hospitalization: None Consults Spiritual Care Consult Needed: No Social Work Consult Needed: No            Disposition:  Disposition Initial Assessment Completed for this Encounter: Yes Disposition of Patient: Admit Type of inpatient treatment program: Adult Patient refused recommended treatment: No Mode of transportation if patient is discharged?: Car  On  Site Evaluation by:   Reviewed with Physician:    Clearnce Sorrel 06/15/2017 6:26 PM

## 2017-06-15 NOTE — Tx Team (Signed)
Initial Treatment Plan 06/15/2017 11:14 PM MCKALE HAFFEY NWG:[id number]    PATIENT STRESSORS: Legal issue Substance abuse   PATIENT STRENGTHS: Communication skills General fund of knowledge Motivation for treatment/growth Physical Health Supportive family/friends Work skills   PATIENT IDENTIFIED PROBLEMS: Depression  Suicidal ideation  "I want help for my suicidal thoughts"  "I want to learn how to express my feelings"               DISCHARGE CRITERIA:  Improved stabilization in mood, thinking, and/or behavior Verbal commitment to aftercare and medication compliance  PRELIMINARY DISCHARGE PLAN: Outpatient therapy Medication management  PATIENT/FAMILY INVOLVEMENT: This treatment plan has been presented to and reviewed with the patient, Caleb Bartlett.  The patient and family have been given the opportunity to ask questions and make suggestions.  Levin Bacon, RN 06/15/2017, 11:14 PM

## 2017-06-15 NOTE — BHH Counselor (Signed)
Per Kenney Houseman, pt has been accepted to [facility] Inpt -Rm 302-2. Admitting Dr. Jama Flavors. Pt signed Voluntary Admission form and Consent to Release- both placed in pt's chart.

## 2017-06-15 NOTE — ED Notes (Signed)
Pt Belongings: Black sneakers, black & white shirt, black shorts, white cell phone charger, I-phone with pink case, blue wallet, [place] ID, black ear buds,  1 set of keyes on blue lanyard, 1 pair of gold colored hoop earrings.  Belongings secured in locker #42.

## 2017-06-16 DIAGNOSIS — Z915 Personal history of self-harm: Secondary | ICD-10-CM

## 2017-06-16 DIAGNOSIS — R45851 Suicidal ideations: Secondary | ICD-10-CM

## 2017-06-16 DIAGNOSIS — F4001 Agoraphobia with panic disorder: Secondary | ICD-10-CM

## 2017-06-16 DIAGNOSIS — F419 Anxiety disorder, unspecified: Secondary | ICD-10-CM

## 2017-06-16 DIAGNOSIS — Z818 Family history of other mental and behavioral disorders: Secondary | ICD-10-CM

## 2017-06-16 MED ORDER — CITALOPRAM HYDROBROMIDE 10 MG PO TABS
10.0000 mg | ORAL_TABLET | Freq: Every day | ORAL | Status: DC
  Administered 2017-06-16 – 2017-06-19 (×4): 10 mg via ORAL
  Filled 2017-06-16 (×5): qty 1

## 2017-06-16 NOTE — BHH Counselor (Signed)
Adult Comprehensive Assessment  Patient ID: Caleb Bartlett, male   DOB: 1995/02/12, 22 y.o.   MRN: [id number]  Information Source: Information source: Patient  Current Stressors:   family history of mental illness (father diagnosed as "paranoid schizophrenic) Limited income Poor support network in community "I don't really trust people and have a hard time making friends."   Living/Environment/Situation:  Living Arrangements: Parent Living conditions (as described by patient or guardian): living with mom in [place] How long has patient lived in current situation?: all my life.  What is atmosphere in current home: Supportive, Comfortable  Family History:  Marital status: Single Are you sexually active?: Yes What is your sexual orientation?: heterosexual Has your sexual activity been affected by drugs, alcohol, medication, or emotional stress?: n/a  Does patient have children?: No  Childhood History:  By whom was/is the patient raised?: Mother Additional childhood history information: Dad in prison since young; mother primary caregiver and aunts helped Description of patient's relationship with caregiver when they were a child: close to mom; no relationship with dad-he got out of prison when pt was 63.  Patient's description of current relationship with people who raised him/her: strained with mother. our bond is not as strong. no relationship with father. father was diagnosed with paranoid schizophrenia.  How were you disciplined when you got in trouble as a child/adolescent?: whoopings; yelled at; grounded Does patient have siblings?: Yes Number of Siblings: 4 Description of patient's current relationship with siblings: youngest of siblings. 2 brothers and 2 sisters- "we don't really talk." "I live with my brother and mom." Did patient suffer any verbal/emotional/physical/sexual abuse as a child?: No Did patient suffer from severe childhood neglect?: No Has patient ever  been sexually abused/assaulted/raped as an adolescent or adult?: No Was the patient ever a victim of a crime or a disaster?: No Witnessed domestic violence?: No Has patient been effected by domestic violence as an adult?: No  Education:  Highest grade of school patient has completed: 12th grade-graduated. 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  Has patient ever been in the [employer]?: No Has patient ever served in combat?: No Did You Receive Any Psychiatric Treatment/Services While in the [employer]?: No Are There Guns or Other Weapons in Your Home?: No Are These Weapons Safely Secured?: (n/a)  Financial Resources:   Financial resources: Income from employment, Media planner, Support from parents / caregiver Does patient have a Lawyer or guardian?: No  Alcohol/Substance Abuse:   What has been your use of drugs/alcohol within the last 12 months?: marijuana-once a week. infrequent.  If attempted suicide, did drugs/alcohol play a role in this?: Yes(passive SI. Attempt at 11 by hanging. "I was in group home because I kept running away.") Alcohol/Substance Abuse Treatment Hx: Past Tx, Outpatient If yes, describe treatment: [facility] for [facility] and meds when younger. (5 years ago) Has alcohol/substance abuse ever caused legal problems?: Yes(July 16th for possession of  firearms.)  Social Support System:   Patient's Community Support System: Poor Describe Community Support System: "it's hard for me to let people get close to me." Type of faith/religion: Ephriam Knuckles How does patient's faith help to cope with current illness?: "I pray and have faith that it will get better."   Leisure/Recreation:   Leisure and Hobbies: "I like to read books and smoke  go for walks, fish, and swim  Strengths/Needs:   What things does the patient do well?: friendly; motivated to get back on meds.  In what areas does patient struggle / problems for patient: depression; impulsivity; expressing how I feel.   Discharge Plan:   Does patient have access to transportation?: Yes(mom's car and license) Will patient be returning to same living situation after discharge?: Yes(home with mom) Currently receiving community mental health services: No If no, would patient like referral for services when discharged?: Yes (What county?)([facility]--) Does patient have financial barriers related to discharge medications?: No  Summary/Recommendations:   Summary and Recommendations (to be completed by the evaluator): Patient is 21yo male living in [place], [place] [facility] [place]) with his mother and brother. Pt presents to the hospital seeking treatment for SI, depression, increased mood lability, marijuana use, and for medication stabilization. Patient reports that he is single, with no children, and employed. Patient has a history of ADHD and Bipolar Disorder. Patient reports that he has been off psychiatric medication for about five years. Recommedations for patient include: crisis stabilization, therapeutic milieu, encourage group attendance and participation, medication management for mood stabilization, and development of comprehensive mental wellness plan. CSW assessing for appropriate referrals.   Caleb Bartlett Smart LCSW 06/16/2017 9:56 AM

## 2017-06-16 NOTE — Progress Notes (Signed)
D   Pt is pleasant on approach and cooperative   He is visible on the milieu and his behavior is appropriate   He did not require medications for sleep tonight   He does endorse some depression and anxiety but reports feeling better A   Verbal support given   Medications administered and effectiveness monitored    Q 15 min checks R   Pt is safe at present time

## 2017-06-16 NOTE — Progress Notes (Signed)
Recreation Therapy Notes  Date: 5.17.19 Time: 0930 Location: 300 Hall Dayroom  Group Topic: Stress Management  Goal Area(s) Addresses:  Patient will verbalize importance of using healthy stress management.  Patient will identify positive emotions associated with healthy stress management.   Behavioral Response: Engaged  Intervention: Stress Management  Activity :  Progressive Muscle Relaxation.  LRT lead patients through the process of tensing each muscle group then releasing the tension.  Patients were to follow along as LRT read script to guide patients through the process.  Education:  Stress Management, Discharge Planning.   Education Outcome: Acknowledges edcuation/In group clarification offered/Needs additional education  Clinical Observations/Feedback: Pt attended and participated in group.    Hessie Varone, LRT/CTRS         Andilynn Delavega A 06/16/2017 12:09 PM 

## 2017-06-16 NOTE — Tx Team (Signed)
Interdisciplinary Treatment and Diagnostic Plan Update  06/16/2017 Time of Session: 0830AM Caleb Bartlett MRN: [id number]  Principal Diagnosis: MDD, recurrent, severe, without psychotic features  Secondary Diagnoses: Active Problems:   Severe recurrent major depression without psychotic features (HCC)   Current Medications:  Current Facility-Administered Medications  Medication Dose Route Frequency Provider Last Rate Last Dose  . acetaminophen (TYLENOL) tablet 650 mg  650 mg Oral Q6H PRN Lindon Romp A, NP      . albuterol (PROVENTIL HFA;VENTOLIN HFA) 108 (90 Base) MCG/ACT inhaler 2 puff  2 puff Inhalation Q4H PRN Rozetta Nunnery, NP      . alum & mag hydroxide-simeth (MAALOX/MYLANTA) 200-200-20 MG/5ML suspension 30 mL  30 mL Oral Q4H PRN Lindon Romp A, NP      . hydrOXYzine (ATARAX/VISTARIL) tablet 25 mg  25 mg Oral TID PRN Lindon Romp A, NP      . magnesium hydroxide (MILK OF MAGNESIA) suspension 30 mL  30 mL Oral Daily PRN Lindon Romp A, NP      . traZODone (DESYREL) tablet 50 mg  50 mg Oral QHS PRN Rozetta Nunnery, NP       PTA Medications: Medications Prior to Admission  Medication Sig Dispense Refill Last Dose  . albuterol (PROVENTIL) 90 MCG/ACT inhaler Inhale 2 puffs into the lungs every 4 (four) hours as needed for shortness of breath.    Past Month at Unknown time  . ARIPiprazole (ABILIFY) 5 MG tablet Take 1 tablet (5 mg total) by mouth daily. (Patient taking differently: Take 5 mg by mouth 2 (two) times daily. )   Unknown at Unknown time  . beclomethasone (QVAR) 40 MCG/ACT inhaler Inhale 3 puffs into the lungs daily as needed (shortness of breath).    Past Month at Unknown time  . dexmethylphenidate (FOCALIN) 10 MG tablet Take 1 tablet (10 mg total) by mouth daily as needed. (Patient taking differently: Take 10 mg by mouth daily. ) 30 tablet 0     Patient Stressors: Legal issue Substance abuse  Patient Strengths: Curator fund of  knowledge Motivation for treatment/growth Physical Health Supportive family/friends Work skills  Treatment Modalities: Medication Management, Group therapy, Case management,  1 to 1 session with clinician, Psychoeducation, Recreational therapy.   Physician Treatment Plan for Primary Diagnosis: MDD, recurrent, severe, without psychotic features  Medication Management: Evaluate patient's response, side effects, and tolerance of medication regimen.  Therapeutic Interventions: 1 to 1 sessions, Unit Group sessions and Medication administration.  Evaluation of Outcomes: Not Met  Physician Treatment Plan for Secondary Diagnosis: Active Problems:   Severe recurrent major depression without psychotic features ([place])   Medication Management: Evaluate patient's response, side effects, and tolerance of medication regimen.  Therapeutic Interventions: 1 to 1 sessions, Unit Group sessions and Medication administration.  Evaluation of Outcomes: Not Met   RN Treatment Plan for Primary Diagnosis: MDD, recurrent, severe, without psychotic features Long Term Goal(s): Knowledge of disease and therapeutic regimen to maintain health will improve  Short Term Goals: Ability to remain free from injury will improve, Ability to demonstrate self-control and Ability to participate in decision making will improve  Medication Management: RN will administer medications as ordered by provider, will assess and evaluate patient's response and provide education to patient for prescribed medication. RN will report any adverse and/or side effects to prescribing provider.  Therapeutic Interventions: 1 on 1 counseling sessions, Psychoeducation, Medication administration, Evaluate responses to treatment, Monitor vital signs and CBGs as ordered, Perform/monitor CIWA, COWS, AIMS and  Fall Risk screenings as ordered, Perform wound care treatments as ordered.  Evaluation of Outcomes: Not Met   LCSW Treatment Plan for  Primary Diagnosis: MDD, recurrent, severe, without psychotic features Long Term Goal(s): Safe transition to appropriate next level of care at discharge, Engage patient in therapeutic group addressing interpersonal concerns.  Short Term Goals: Engage patient in aftercare planning with referrals and resources, Increase emotional regulation, Facilitate patient progression through stages of change regarding substance use diagnoses and concerns and Increase skills for wellness and recovery  Therapeutic Interventions: Assess for all discharge needs, 1 to 1 time with Social worker, Explore available resources and support systems, Assess for adequacy in community support network, Educate family and significant other(s) on suicide prevention, Complete Psychosocial Assessment, Interpersonal group therapy.  Evaluation of Outcomes: Not Met   Progress in Treatment: Attending groups: No. New to unit.Continuing to assess.  Participating in groups: No. Taking medication as prescribed: Yes. Toleration medication: Yes. Family/Significant other contact made: No, will contact:  family member/mother if pt consents to collateral contact.  Patient understands diagnosis: Yes. Discussing patient identified problems/goals with staff: Yes. Medical problems stabilized or resolved: Yes. Denies suicidal/homicidal ideation: Yes. Issues/concerns per patient self-inventory: No. Other: n/a   New problem(s) identified: No, Describe:  n/a  New Short Term/Long Term Goal(s): detox, medication management for mood stabilization; elimination of SI thoughts; development of comprehensive mental wellness/sobriety plan.   Patient Goal: "To deal with my suicidal thoughts and depression. To get back on my medication."   Discharge Plan or Barriers: CSW assessing for appropriate referrals.  Reason for Continuation of Hospitalization: Anxiety Depression Medication stabilization Suicidal ideation  Estimated Length of Stay:  Tuesday, 06/20/17  Attendees: Patient: Caleb Bartlett 06/16/2017 8:09 AM  Physician: Dr. Parke Poisson MD; Dr. Nancy Fetter MD 06/16/2017 8:09 AM  Nursing: Santiago Glad RN; Opal Sidles RN 06/16/2017 8:09 AM  RN Care Manager:x 06/16/2017 8:09 AM  Social Worker: Press photographer, LCSW 06/16/2017 8:09 AM  Recreational Therapist: x 06/16/2017 8:09 AM  Other: Lindell Spar NP 06/16/2017 8:09 AM  Other:  06/16/2017 8:09 AM  Other: 06/16/2017 8:09 AM    Scribe for Treatment Team: [place], LCSW 06/16/2017 8:09 AM

## 2017-06-16 NOTE — H&P (Addendum)
Psychiatric Admission Assessment Adult  Patient Identification: Caleb Bartlett MRN:  [id number] Date of Evaluation:  06/16/2017 Chief Complaint: " I was standing on a bridge" Principal Diagnosis: Suicidal Ideations Diagnosis:   Patient Active Problem List   Diagnosis Date Noted  . Severe recurrent major depression without psychotic features ([facility]) [F33.2] 06/15/2017  . Routine screening for STI (sexually transmitted infection) [Z11.3] 02/16/2016  . Dysphagia, pharyngoesophageal phase [R13.14] 02/08/2010  . BIPOLAR DISORDER UNSPECIFIED [F31.9] 09/17/2008  . OPPOSITIONAL DEFIANT DISORDER [F91.3] 04/01/2008  . 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 Attention deficit hyperactivity disorder (ADHD) [F90.9] 03/30/2006  . RHINITIS, ALLERGIC [J30.9] 03/30/2006  . ASTHMA, PERSISTENT [J45.909] 03/30/2006   History of Present Illness: 22 year old male , brought to ED by [employer]. States he was standing on a bridge during his usual morning walk: "I was leaning on rail", and states he had been contemplating jumping off, but states " I don't think I would have done it ". He does not elaborate why he felt suicidal and does not endorse any particular stressor or contributing factor. He explains that he suffers from significant anxiety and that at times anxiety becomes more severe, subjectively overwhelming, leading to suicidal thoughts .  States that police arrived on scene and brought him to hospital. He does not know who called police. "  He reports history of depression, states he had suicidal ideations on day of admission, but not before then. However, chart notes indicate mother stated that he had recently been writing suicide notes . He states " my depressions come in phases, I can feel depressed for a few hours and then I'm OK again".  Of note, he does not endorse any significant neuro-vegetative symptoms of depression, and states appetite, sleep, energy level have all been  normal. In addition to recent suicidal ideations, he endorses history of anxiety, and describes history of excessive generalized worry, and also  history of agoraphobia, some panic attacks. Associated Signs/Symptoms: Depression Symptoms:  suicidal thoughts with specific plan, (Hypo) Manic Symptoms:  Denies  Anxiety Symptoms:  Reports he feels he worries excessively, states " I worry about everything, even when I know things are OK" Psychotic Symptoms:  Denies  PTSD Symptoms: Denies  Total Time spent with patient: 45 minutes  Past Psychiatric History:no prior psychiatric admissions, one past suicidal attempt by hanging when he was 22 years old, denies any history of self cutting or self injurious behaviors, denies history of psychosis, does not endorse any clear history of mania or hypomania, denies history of violence.  Describes agoraphobia and increased anxiety in crowded , public situations, describes occasional panic attacks.   Is the patient at risk to self? Yes.    Has the patient been a risk to self in the past 6 months? No.  Has the patient been a risk to self within the distant past? Yes.    Is the patient a risk to others? No.  Has the patient been a risk to others in the past 6 months? No.  Has the patient been a risk to others within the distant past? No.   Prior Inpatient Therapy:  denies  Prior Outpatient Therapy:  not at this time  Alcohol Screening: 1. How often do you have a drink containing alcohol?: Never 2. How many drinks containing alcohol do you have on a typical day when you are drinking?: 1 or 2 3. How often do you have six or more drinks on one occasion?: Never AUDIT-C Score:  0 9. Have you or someone else been injured as a result of your drinking?: No 10. Has a relative or friend or a doctor or another health worker been concerned about your drinking or suggested you cut down?: No Alcohol Use Disorder Identification Test Final Score (AUDIT):  0 Intervention/Follow-up: AUDIT Score <7 follow-up not indicated Substance Abuse History in the last 12 months: denies alcohol abuse, smokes cannabis once a week or so, denies other drug use  Consequences of Substance Abuse: Denies  Previous Psychotropic Medications: states he has not taken psychiatric medications in a long time. In the past has been prescribed Abilify " for my mood", and Focalin " for ADHD". States these medications were prescribed when he was about 14-15.  Psychological Evaluations:  No  Past Medical History: as below, NKDA Past Medical History:  Diagnosis Date  . Asthma   . GERD (gastroesophageal reflux disease)     Past Surgical History:  Procedure Laterality Date  . KIDNEY SURGERY     Family History:  Parents alive, separated , distant relationship with father . Has 4 brothers, 2 sisters  Family Psychiatric  History: states that father has history of schizophrenia, no suicides in family, no history of drug or alcohol abuse in family Tobacco Screening: Does not smoke or use tobacco products  Social History: 22 year old single male, no children, lives with mother, employed , works at [employer] . Social History   Substance and Sexual Activity  Alcohol Use No     Social History   Substance and Sexual Activity  Drug Use No    Additional Social History: Marital status: Single Are you sexually active?: Yes What is your sexual orientation?: heterosexual Has your sexual activity been affected by drugs, alcohol, medication, or emotional stress?: n/a  Does patient have children?: No  Allergies:  No Known Allergies Lab Results:  Results for orders placed or performed during the hospital encounter of 06/15/17 (from the past 48 hour(s))  Comprehensive metabolic panel     Status: None   Collection Time: 06/15/17 12:45 PM  Result Value Ref Range   Sodium 139 135 - 145 mmol/L   Potassium 3.6 3.5 - 5.1 mmol/L   Chloride 106 101 - 111 mmol/L   CO2 23 22 - 32  mmol/L   Glucose, Bld 94 65 - 99 mg/dL   BUN 14 6 - 20 mg/dL   Creatinine, Ser 0.99 0.61 - 1.24 mg/dL   Calcium 9.7 8.9 - 10.3 mg/dL   Total Protein 7.9 6.5 - 8.1 g/dL   Albumin 4.4 3.5 - 5.0 g/dL   AST 25 15 - 41 U/L   ALT 33 17 - 63 U/L   Alkaline Phosphatase 85 38 - 126 U/L   Total Bilirubin 0.8 0.3 - 1.2 mg/dL   GFR calc non Af Amer >60 >60 mL/min   GFR calc Af Amer >60 >60 mL/min    Comment: (NOTE) The eGFR has been calculated using the CKD EPI equation. This calculation has not been validated in all clinical situations. eGFR's persistently <60 mL/min signify possible Chronic Kidney Disease.    Anion gap 10 5 - 15    Comment: Performed at [facility], [place] [address] [place], [place] [address]  Ethanol     Status: None   Collection Time: 06/15/17 12:45 PM  Result Value Ref Range   Alcohol, Ethyl (B) <10 <10 mg/dL    Comment: (NOTE) Lowest detectable limit for serum alcohol is 10 mg/dL. For  medical purposes only. Performed at [facility], [place] [address], [place], [place] [address]   Salicylate level     Status: None   Collection Time: 06/15/17 12:45 PM  Result Value Ref Range   Salicylate Lvl <8.1 2.8 - 30.0 mg/dL    Comment: Performed at [facility], [place] [address] [place], [place] [address]  Acetaminophen level     Status: Abnormal   Collection Time: 06/15/17 12:45 PM  Result Value Ref Range   Acetaminophen (Tylenol), Serum <10 (L) 10 - 30 ug/mL    Comment: (NOTE) Therapeutic concentrations vary significantly. A range of 10-30 ug/mL  may be an effective concentration for many patients. However, some  are best treated at concentrations outside of this range. Acetaminophen concentrations >150 ug/mL at 4 hours after ingestion  and >50 ug/mL at 12 hours after ingestion are often associated with  toxic reactions. Performed at [facility], [place] [address] [place], [place] [address]   cbc      Status: Abnormal   Collection Time: 06/15/17 12:45 PM  Result Value Ref Range   WBC 4.5 4.0 - 10.5 K/uL   RBC 6.11 (H) 4.22 - 5.81 MIL/uL   Hemoglobin 16.5 13.0 - 17.0 g/dL   HCT 48.6 39.0 - 52.0 %   MCV 79.5 78.0 - 100.0 fL   MCH 27.0 26.0 - 34.0 pg   MCHC 34.0 30.0 - 36.0 g/dL   RDW 13.7 11.5 - 15.5 %   Platelets 251 150 - 400 K/uL    Comment: Performed at [facility], [place] [address] [place], [place] [address]  Rapid urine drug screen (hospital performed)     Status: Abnormal   Collection Time: 06/15/17  1:05 PM  Result Value Ref Range   Opiates NONE DETECTED NONE DETECTED   Cocaine NONE DETECTED NONE DETECTED   Benzodiazepines NONE DETECTED NONE DETECTED   Amphetamines NONE DETECTED NONE DETECTED   Tetrahydrocannabinol POSITIVE (A) NONE DETECTED   Barbiturates NONE DETECTED NONE DETECTED    Comment: (NOTE) DRUG SCREEN FOR MEDICAL PURPOSES ONLY.  IF CONFIRMATION IS NEEDED FOR ANY PURPOSE, NOTIFY LAB WITHIN 5 DAYS. LOWEST DETECTABLE LIMITS FOR URINE DRUG SCREEN Drug Class                     Cutoff (ng/mL) Amphetamine and metabolites    1000 Barbiturate and metabolites    200 Benzodiazepine                 101 Tricyclics and metabolites     300 Opiates and metabolites        300 Cocaine and metabolites        300 THC                            50 Performed at [facility], [place] [address], [place], [place] [address]     Blood Alcohol level:  Lab Results  Component Value Date   ETH <10 58/52/7782    Metabolic Disorder Labs:  No results found for: HGBA1C, MPG No results found for: PROLACTIN Lab Results  Component Value Date   CHOL 225 09/02/2008   TRIG 85 09/02/2008   HDL 80 09/02/2008   VLDL 17 09/02/2008   LDLCALC 128 09/02/2008    Current Medications: Current Facility-Administered Medications  Medication Dose Route Frequency Provider Last Rate Last Dose  . acetaminophen (TYLENOL) tablet 650  mg  650 mg Oral Q6H  PRN Lindon Romp A, NP      . albuterol (PROVENTIL HFA;VENTOLIN HFA) 108 (90 Base) MCG/ACT inhaler 2 puff  2 puff Inhalation Q4H PRN Lindon Romp A, NP      . alum & mag hydroxide-simeth (MAALOX/MYLANTA) 200-200-20 MG/5ML suspension 30 mL  30 mL Oral Q4H PRN Lindon Romp A, NP      . hydrOXYzine (ATARAX/VISTARIL) tablet 25 mg  25 mg Oral TID PRN Lindon Romp A, NP      . magnesium hydroxide (MILK OF MAGNESIA) suspension 30 mL  30 mL Oral Daily PRN Lindon Romp A, NP      . traZODone (DESYREL) tablet 50 mg  50 mg Oral QHS PRN Rozetta Nunnery, NP       PTA Medications: Medications Prior to Admission  Medication Sig Dispense Refill Last Dose  . albuterol (PROVENTIL) 90 MCG/ACT inhaler Inhale 2 puffs into the lungs every 4 (four) hours as needed for shortness of breath.    Past Month at Unknown time  . ARIPiprazole (ABILIFY) 5 MG tablet Take 1 tablet (5 mg total) by mouth daily. (Patient taking differently: Take 5 mg by mouth 2 (two) times daily. )   Unknown at Unknown time  . beclomethasone (QVAR) 40 MCG/ACT inhaler Inhale 3 puffs into the lungs daily as needed (shortness of breath).    Past Month at Unknown time  . dexmethylphenidate (FOCALIN) 10 MG tablet Take 1 tablet (10 mg total) by mouth daily as needed. (Patient taking differently: Take 10 mg by mouth daily. ) 30 tablet 0     Musculoskeletal: Strength & Muscle Tone: within normal limits Gait & Station: normal Patient leans: N/A  Psychiatric Specialty Exam: Physical Exam  Review of Systems  Constitutional: Negative.   HENT: Negative.   Eyes: Negative.   Respiratory: Negative.   Cardiovascular: Negative.   Gastrointestinal: Negative.   Genitourinary: Negative.   Musculoskeletal: Negative.   Skin: Negative.   Neurological: Negative for seizures and headaches.  Endo/Heme/Allergies: Negative.   Psychiatric/Behavioral: Positive for depression. The patient is nervous/anxious.   All other systems reviewed and are negative.   Blood  pressure 113/78, pulse 87, temperature 97.7 F (36.5 C), temperature source Oral, resp. rate 16, height '5\' 9"'  (1.753 m), weight 102.1 kg (225 lb).Body mass index is 33.23 kg/m.  General Appearance: Fairly Groomed  Eye Contact:  Good  Speech:  Normal Rate  Volume:  Normal  Mood:  reports mood improved at this time, minimizes depression at present, and describes mood as 8/10  Affect:  Appropriate and vaguely anxious  Thought Process:  Linear and Descriptions of Associations: Intact  Orientation:  Other:  fully alert and attentive   Thought Content:  denies hallucinations, no delusions, not internally preoccupied  Suicidal Thoughts:  No denies any suicidal or self injurious ideations, denies homicidal or violent ideations, contracts for safety   Homicidal Thoughts:  No  Memory:  recent and remote grossly intact   Judgement:  Fair  Insight:  Fair  Psychomotor Activity:  Normal  Concentration:  Concentration: Good and Attention Span: Good  Recall:  Good  Fund of Knowledge:  Good  Language:  Good  Akathisia:  Negative  Handed:  Right  AIMS (if indicated):     Assets:  Communication Skills Resilience  ADL's:  Intact  Cognition:  WNL  Sleep:  Number of Hours: 6.25    Treatment Plan Summary: Daily contact with patient to assess and evaluate symptoms and  progress in treatment, Medication management, Plan inpatient admission  and medications as below  Observation Level/Precautions:  15 minute checks  Laboratory:  as needed - TSH   Psychotherapy: milieu and group therapy   Medications:  We discussed options, agrees to Celexa trial to address anxiety, depression. Start CELEXA 10 mgr QDAY initially . VISTARIL PRN for anxiety TRAZODONE PRN for insomnia  Consultations:  As needed   Discharge Concerns:  -   Estimated LOS: 4-5 days   Other:     Physician Treatment Plan for Primary Diagnosis: suicidal ideations Long Term Goal(s): Improvement in symptoms so as ready for discharge  Short  Term Goals: Ability to identify changes in lifestyle to reduce recurrence of condition will improve and Ability to maintain clinical measurements within normal limits will improve  Physician Treatment Plan for Secondary Diagnosis: Consider GAD, Agoraphobia   Long Term Goal(s): Improvement in symptoms so as ready for discharge  Short Term Goals: Ability to identify changes in lifestyle to reduce recurrence of condition will improve and Ability to maintain clinical measurements within normal limits will improve  I certify that inpatient services furnished can reasonably be expected to improve the patient's condition.    Jenne Campus, MD 5/17/20192:01 PM

## 2017-06-16 NOTE — Progress Notes (Signed)
Patient ID: Caleb Bartlett, male   DOB: 1995/03/26, 22 y.o.   MRN: [id number]  D: Assumed care patient @ 2330. Patient in bed sleeping. Respiration regular and unlabored. No sign of distress noted at this time A: 15 mins checks for safety. R: Patient remains safe.

## 2017-06-16 NOTE — [facility] Suicide Risk Assessment (Signed)
[facility] Admission Suicide Risk Assessment   Nursing information obtained from:  Patient Demographic factors:  Male Current Mental Status:  NA Loss Factors:  Legal issues Historical Factors:  Prior suicide attempts, Family history of mental illness or substance abuse, Impulsivity Risk Reduction Factors:  Sense of responsibility to family  Total Time spent with patient: 45 minutes Principal Problem: Suicidal Ideations, consider MDD, GAD  Diagnosis:   Patient Active Problem List   Diagnosis Date Noted  . Severe recurrent major depression without psychotic features (HCC) [F33.2] 06/15/2017  . Routine screening for STI (sexually transmitted infection) [Z11.3] 02/16/2016  . Dysphagia, pharyngoesophageal phase [R13.14] 02/08/2010  . BIPOLAR DISORDER UNSPECIFIED [F31.9] 09/17/2008  . OPPOSITIONAL DEFIANT DISORDER [F91.3] 04/01/2008  . 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 Attention deficit hyperactivity disorder (ADHD) [F90.9] 03/30/2006  . RHINITIS, ALLERGIC [J30.9] 03/30/2006  . ASTHMA, PERSISTENT [J45.909] 03/30/2006    Continued Clinical Symptoms:  Alcohol Use Disorder Identification Test Final Score (AUDIT): 0 The "Alcohol Use Disorders Identification Test", Guidelines for Use in Primary Care, Second Edition.  World Science writer [facility]). Score between 0-7:  no or low risk or alcohol related problems. Score between 8-15:  moderate risk of alcohol related problems. Score between 16-19:  high risk of alcohol related problems. Score 20 or above:  warrants further diagnostic evaluation for alcohol dependence and treatment.   CLINICAL FACTORS:  Patient is a 22 year old single male, lives with mother, presented to ED via [employer] after being found on a bridge contemplating suicide. Patient minimizes depression prior to that day, but does describe brief episodes of depression and a suicide attempt as a child. Chart notes indicate mother reported patient has been writing  suicide notes. Patient stresses anxiety, described as GAD and Agoraphobia, as major symptom and contributing to SI.    Psychiatric Specialty Exam: Physical Exam  ROS  Blood pressure 113/78, pulse 87, temperature 97.7 F (36.5 C), temperature source Oral, resp. rate 16, height  (1.753 m), weight 102.1 kg (225 lb).Body mass index is 33.23 kg/m.  See admit note MSE                                                        COGNITIVE FEATURES THAT CONTRIBUTE TO RISK:  Closed-mindedness and Loss of executive function    SUICIDE RISK:   Moderate:  Frequent suicidal ideation with limited intensity, and duration, some specificity in terms of plans, no associated intent, good self-control, limited dysphoria/symptomatology, some risk factors present, and identifiable protective factors, including available and accessible social support.  PLAN OF CARE: Patient will be admitted to inpatient psychiatric unit for stabilization and safety. Will provide and encourage milieu participation. Provide medication management and maked adjustments as needed.  Will follow daily.    I certify that inpatient services furnished can reasonably be expected to improve the patient's condition.   Craige Cotta, MD 06/16/2017, 2:33 PM

## 2017-06-16 NOTE — Plan of Care (Signed)
D: Bard was pleasant upon approach this morning. He denied SI, HI, and AVH. He appeared less depressed than he had looked yesterday in the [facility]. He reported good sleep, good appetite, normal energy level, and good concentration. He rated his depression 3/10 and anxiety 3/10. He said he did not feel hopeless. He has been out in the milieu, and he went to the gym.  A: No meds were ordered to be given this a.m. Q15 safety checks maintained. Support/encouragement offered.  R: Pt remains free from harm and continues with treatment. Will continue to monitor for needs/safety.   Problem: Activity: Goal: Sleeping patterns will improve Outcome: Progressing   Problem: Coping: Goal: Ability to verbalize frustrations and anger appropriately will improve Outcome: Progressing Goal: Ability to demonstrate self-control will improve Outcome: Progressing   Problem: Physical Regulation: Goal: Ability to maintain clinical measurements within normal limits will improve Outcome: Progressing   Problem: Safety: Goal: Periods of time without injury will increase Outcome: Progressing   Problem: Self-Concept: Goal: Ability to disclose and discuss suicidal ideas will improve Outcome: Progressing

## 2017-06-16 NOTE — [facility] Suicide Risk Assessment (Signed)
[facility] INPATIENT:  Family/Significant Other Suicide Prevention Education  Suicide Prevention Education:  Contact Attempts: [name] (pt's mother) [phone] has been identified by the patient as the family member/significant other with whom the patient will be residing, and identified as the person(s) who will aid the patient in the event of a mental health crisis.  With written consent from the patient, two attempts were made to provide suicide prevention education, prior to and/or following the patient's discharge.  We were unsuccessful in providing suicide prevention education.  A suicide education pamphlet was given to the patient to share with family/significant other.  Date and time of first attempt: 06/16/17 at 10:07AM (voicemail left requesting call back at earliest con  [employer] LCSW 06/16/2017, 10:06 AM   SPE and aftercare reviewed with pt's mother. Pt's mother shared that he has his first med management appt at [facility] on 6/5 at 10am but is hoping that CSW can get this moved to sooner and get a therapy appt scheduled. She has no concerns regarding pt returning home at discharge. No access to weapons/firearms.   Trula Slade, MSW, LCSW Clinical Social Worker 06/16/2017 1:00 PM

## 2017-06-16 NOTE — [facility] Group Notes (Signed)
LCSW Group Therapy Note  06/16/2017 1:15pm  Type of Therapy and Topic:  Group Therapy:  Feelings around Relapse and Recovery  Participation Level:  Active   Description of Group:    Patients in this group will discuss emotions they experience before and after a relapse. They will process how experiencing these feelings, or avoidance of experiencing them, relates to having a relapse. Facilitator will guide patients to explore emotions they have related to recovery. Patients will be encouraged to process which emotions are more powerful. They will be guided to discuss the emotional reaction significant others in their lives may have to their relapse or recovery. Patients will be assisted in exploring ways to respond to the emotions of others without this contributing to a relapse.  Therapeutic Goals: 1. Patient will identify two or more emotions that lead to a relapse for them 2. Patient will identify two emotions that result when they relapse 3. Patient will identify two emotions related to recovery 4. Patient will demonstrate ability to communicate their needs through discussion and/or role plays   Summary of Patient Progress:  Caleb Bartlett was attentive and engaged during today's processing group. He shared that he tends to relapse when his mood is spiraling into depression. "My mood is all over the place." PT reports smoking marijuana "to calm my mind down" but acknowledged that with a mental illness, it may end up making symptoms worse. He continues to show progress in the group setting with improving insight.    Therapeutic Modalities:   Cognitive Behavioral Therapy Solution-Focused Therapy Assertiveness Training Relapse Prevention Therapy   Caleb Bartlett Smart, LCSW 06/16/2017 12:33 PM

## 2017-06-17 DIAGNOSIS — Z79899 Other long term (current) drug therapy: Secondary | ICD-10-CM

## 2017-06-17 LAB — TSH: TSH: 1.014 u[IU]/mL (ref 0.350–4.500)

## 2017-06-17 NOTE — [facility] Group Notes (Signed)
Pt was invited but did not attend orientation/goals group. 

## 2017-06-17 NOTE — Progress Notes (Signed)
[facility] MD Progress Note  06/17/2017 2:31 PM Caleb Bartlett  MRN:  [id number]   Subjective: Patient reports that he is feeling very good today and he feels that the medications are really working.  He denies any medication side effects.  He reports that his mom talked to him and his mom noticed a significant difference already.  He is hoping to be able to discharge soon potentially tomorrow or Monday.  Patient denies any SI/HI/AVH and contracts for safety.  Objective: Patient's chart and findings reviewed and discussed with treatment team.  Patient presents in the day room attending group and is pleasant and cooperative.  Patient has appropriate affect and has been smiling and laughing with peers and staff today.  We will continue patient's medications as prescribed.  Informed patient that potential discharge on Monday would be best possibility to ensure appointments are arranged for follow-up.  Principal Problem: Severe recurrent major depression without psychotic features (HCC) Diagnosis:   Patient Active Problem List   Diagnosis Date Noted  . Severe recurrent major depression without psychotic features (HCC) [F33.2] 06/15/2017  . Routine screening for STI (sexually transmitted infection) [Z11.3] 02/16/2016  . Dysphagia, pharyngoesophageal phase [R13.14] 02/08/2010  . BIPOLAR DISORDER UNSPECIFIED [F31.9] 09/17/2008  . OPPOSITIONAL DEFIANT DISORDER [F91.3] 04/01/2008  . 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 Attention deficit hyperactivity disorder (ADHD) [F90.9] 03/30/2006  . RHINITIS, ALLERGIC [J30.9] 03/30/2006  . ASTHMA, PERSISTENT [J45.909] 03/30/2006   Total Time spent with patient: 20 minutes  Past Psychiatric History: See H&P  Past Medical History:  Past Medical History:  Diagnosis Date  . Asthma   . GERD (gastroesophageal reflux disease)     Past Surgical History:  Procedure Laterality Date  . KIDNEY SURGERY     Family History: History reviewed.  No pertinent family history. Family Psychiatric  History: See H&P Social History:  Social History   Substance and Sexual Activity  Alcohol Use No     Social History   Substance and Sexual Activity  Drug Use No    Social History   Socioeconomic History  . Marital status: Single    Spouse name: Not on file  . Number of children: Not on file  . Years of education: Not on file  . Highest education level: Not on file  Occupational History  . Not on file  Social Needs  . Financial resource strain: Not on file  . Food insecurity:    Worry: Not on file    Inability: Not on file  . Transportation needs:    Medical: Not on file    Non-medical: Not on file  Tobacco Use  . Smoking status: Never Smoker  . Smokeless tobacco: Never Used  Substance and Sexual Activity  . Alcohol use: No  . Drug use: No  . Sexual activity: Yes  Lifestyle  . Physical activity:    Days per week: Not on file    Minutes per session: Not on file  . Stress: Not on file  Relationships  . Social connections:    Talks on phone: Not on file    Gets together: Not on file    Attends religious service: Not on file    Active member of club or organization: Not on file    Attends meetings of clubs or organizations: Not on file    Relationship status: Not on file  Other Topics Concern  . Not on file  Social History Narrative  . Not on file  Additional Social History:                         Sleep: Good  Appetite:  Good  Current Medications: Current Facility-Administered Medications  Medication Dose Route Frequency Provider Last Rate Last Dose  . acetaminophen (TYLENOL) tablet 650 mg  650 mg Oral Q6H PRN Nira Conn A, NP      . albuterol (PROVENTIL HFA;VENTOLIN HFA) 108 (90 Base) MCG/ACT inhaler 2 puff  2 puff Inhalation Q4H PRN Nira Conn A, NP      . alum & mag hydroxide-simeth (MAALOX/MYLANTA) 200-200-20 MG/5ML suspension 30 mL  30 mL Oral Q4H PRN Nira Conn A, NP      .  citalopram (CELEXA) tablet 10 mg  10 mg Oral Daily Cobos, Rockey Situ, MD   10 mg at 06/17/17 0755  . hydrOXYzine (ATARAX/VISTARIL) tablet 25 mg  25 mg Oral TID PRN Nira Conn A, NP      . magnesium hydroxide (MILK OF MAGNESIA) suspension 30 mL  30 mL Oral Daily PRN Nira Conn A, NP      . traZODone (DESYREL) tablet 50 mg  50 mg Oral QHS PRN Jackelyn Poling, NP        Lab Results:  Results for orders placed or performed during the hospital encounter of 06/15/17 (from the past 48 hour(s))  TSH     Status: None   Collection Time: 06/17/17  7:19 AM  Result Value Ref Range   TSH 1.014 0.350 - 4.500 uIU/mL    Comment: Performed by a 3rd Generation assay with a functional sensitivity of <=0.01 uIU/mL. Performed at [facility], [address], [place], [place] [address]     Blood Alcohol level:  Lab Results  Component Value Date   ETH <10 06/15/2017    Metabolic Disorder Labs: No results found for: HGBA1C, MPG No results found for: PROLACTIN Lab Results  Component Value Date   CHOL 225 09/02/2008   TRIG 85 09/02/2008   HDL 80 09/02/2008   VLDL 17 09/02/2008   LDLCALC 128 09/02/2008    Physical Findings: AIMS: Facial and Oral Movements Muscles of Facial Expression: None, normal Lips and Perioral Area: None, normal Jaw: None, normal Tongue: None, normal,Extremity Movements Upper (arms, wrists, hands, fingers): None, normal Lower (legs, knees, ankles, toes): None, normal, Trunk Movements Neck, shoulders, hips: None, normal, Overall Severity Severity of abnormal movements (highest score from questions above): None, normal Incapacitation due to abnormal movements: None, normal Patient's awareness of abnormal movements (rate only patient's report): No Awareness, Dental Status Current problems with teeth and/or dentures?: No Does patient usually wear dentures?: No  CIWA:    COWS:     Musculoskeletal: Strength & Muscle Tone: within normal limits Gait &  Station: normal Patient leans: N/A  Psychiatric Specialty Exam: Physical Exam  ROS  Blood pressure 124/78, pulse 88, temperature 98 F (36.7 C), temperature source Oral, resp. rate 16, height  (1.753 m), weight 102.1 kg (225 lb).Body mass index is 33.23 kg/m.  General Appearance: Casual  Eye Contact:  Good  Speech:  Clear and Coherent and Normal Rate  Volume:  Normal  Mood:  Euthymic  Affect:  Appropriate  Thought Process:  Goal Directed and Descriptions of Associations: Intact  Orientation:  Full (Time, Place, and Person)  Thought Content:  WDL  Suicidal Thoughts:  No  Homicidal Thoughts:  No  Memory:  Immediate;   Good Recent;   Good Remote;  Good  Judgement:  Fair  Insight:  Good  Psychomotor Activity:  Normal  Concentration:  Concentration: Good and Attention Span: Good  Recall:  Good  Fund of Knowledge:  Good  Language:  Good  Akathisia:  No  Handed:  Right  AIMS (if indicated):     Assets:  Communication Skills Desire for Improvement Financial Resources/Insurance Housing Physical Health Social Support Transportation  ADL's:  Intact  Cognition:  WNL  Sleep:  Number of Hours: 6.25   Problems addressed MDD severe recurrent  Treatment Plan Summary: Daily contact with patient to assess and evaluate symptoms and progress in treatment, Medication management and Plan is to: -Continue Celexa 10 mg p.o. daily for mood stability -Continue Vistaril 25 mg 3 times daily as needed for anxiety -Continue Trazodone 50 mg p.o. nightly as needed for insomnia -Encourage group therapy participation -Potential discharge on Monday  Maryfrances Bunnell, FNP 06/17/2017, 2:31 PM

## 2017-06-17 NOTE — Progress Notes (Signed)
D   Pt is pleasant on approach and cooperative   He is visible on the milieu and his behavior is appropriate   He did not require medications for sleep tonight   He does endorse some depression and anxiety but reports feeling better  Pt reports he is ready for discharge A   Verbal support given   Medications administered and effectiveness monitored    Q 15 min checks R   Pt is safe at present time

## 2017-06-17 NOTE — [facility] Group Notes (Signed)
Idnetifying Needs   Date:  06/17/2017  Time:  5:10 PM  Type of Therapy:  Nurse Education  /  The group focuses on teaching patients how to identify their needs and then hwo to develop the skills needed t get their needs met.  Participation Level:  Active  Participation Quality:  Attentive  Affect:  Appropriate  Cognitive:  Alert  Insight:  Appropriate  Engagement in Group:  Engaged  Modes of Intervention:  Education  Summary of Progress/Problems:  Caleb Bartlett 06/17/2017, 5:10 PM

## 2017-06-17 NOTE — Progress Notes (Signed)
Patient did attend the evening speaker AA meeting.  

## 2017-06-17 NOTE — Progress Notes (Signed)
D: Caleb Bartlett has been pleasant and outgoing in the milieu today. He denied SI, HI, and AVH. He reported fair sleep, good appetite, normal energy level, and good concentration. He rated his depression 0/10, hopelessness 0/10, and anxiety 3/10.   A: Meds given as ordered. Q15 safety checks maintained. Support/encouragement offered.  R: Pt remains free from harm and continues with treatment. Will continue to monitor for needs/safety.

## 2017-06-17 NOTE — [facility] Group Notes (Signed)
[facility] Group Notes: (Clinical Social Work)   06/17/2017      Type of Therapy:  Group Therapy   Participation Level:  Did Not Attend despite MHT prompting - he did come into the room for the last 5 minutes of group and was pleasant   Ambrose Mantle, LCSW 06/17/2017, 11:10 AM

## 2017-06-18 NOTE — [facility] Group Notes (Signed)
[facility] LCSW Group Therapy Note  Date/Time:  06/18/2017  10:00AM-11:00AM  Type of Therapy and Topic:  Group Therapy:  Obstacles at Discharge  Participation Level:  Active   Description of Group: In this process group, members discussed their anticipated obstacles to wellness when they discharge.  We then listened to five different songs that dealt with addiction, depression, and anxiety.  The group discussed how they could relate to each song and how each could help them to stay focused on their wellness.    Therapeutic Goals: 1. Patients will think about and acknowledge the obstacles they think they will face at hospital discharge 2. Patients will be able to realize that they are not alone and others are actually facing similar obstacles 3. Patients will identify how music can help or harm their recovery efforts 4. Patients will explore the use of music as a coping skill  Summary of Patient Progress:  At the beginning of group, patient expressed his surroundings, I.e. His circle of friends, is going to need to be cut off because they are the hardest obstacle for him..  His participation in group included frequent comments, more frequently than any other patient, all while leafing through a magazine constantly..  Therapeutic Modalities: Activity Processing   Ambrose Mantle, LCSW

## 2017-06-18 NOTE — Progress Notes (Signed)
DAR NOTE: Patient presents with anxious affect and depressed mood. Pt has been observed in the milieu interacting well with both peers and staff. Pt stated he had a good sleep , good appetite, normal energy and good concentration. Denies pain, auditory and visual hallucinations.  Rates depression at 0, hopelessness at 0, and anxiety at 0.  Maintained on routine safety checks.  Medications given as prescribed.  Support and encouragement offered as needed.  Attended group and participated.  States goal for today is " My thoughts." Patient observed socializing with peers in the dayroom.  Offered no complaint.

## 2017-06-18 NOTE — Progress Notes (Signed)
Patient did attend the evening speaker AA meeting.  

## 2017-06-18 NOTE — [facility] Group Notes (Signed)
[facility] Group Notes:  (Nursing/MHT/Case Management/Adjunct)  Date:  06/18/2017  Time:  3:27 PM  Type of Therapy:  Psychoeducational Skills  Participation Level:  Active  Participation Quality:  Appropriate  Affect:  Appropriate  Cognitive:  Appropriate  Insight:  Appropriate  Engagement in Group:  Engaged  Modes of Intervention:  Problem-solving  Summary of Progress/Problems: The group discussed about anger manag Bethann Punches 06/18/2017, 3:27 PM

## 2017-06-18 NOTE — Progress Notes (Signed)
[facility] MD Progress Note  06/18/2017 1:13 PM Caleb Bartlett  MRN:  [id number]   Subjective: Patient states that he is continuing to improve and that he feels much better.  He reports that he has been learning a lot from the groups and has started writing down his feelings in a journal which helps him realize what is going on in his life and for what he needs to be focused on.  He states that he feels that he is ready to discharge hopefully tomorrow after he speaks with social work and sets up his outpatient appointments.  He denies any SI/HI/AVH and contracts for safety.  Denies any medication side effects.  Objective: Patient's chart and findings reviewed and discussed with treatment team.  Patient presents in the day room and has been interacting with peers and staff appropriately.  Patient is pleasant cooperative and talkative.  He is smiling and laughing a lot with peers and staff does not appear to be depressed at all.  We will continue current medications with plan to discharge tomorrow after outpatient appointments are arranged.  Principal Problem: Severe recurrent major depression without psychotic features (HCC) Diagnosis:   Patient Active Problem List   Diagnosis Date Noted  . Severe recurrent major depression without psychotic features (HCC) [F33.2] 06/15/2017  . Routine screening for STI (sexually transmitted infection) [Z11.3] 02/16/2016  . Dysphagia, pharyngoesophageal phase [R13.14] 02/08/2010  . BIPOLAR DISORDER UNSPECIFIED [F31.9] 09/17/2008  . OPPOSITIONAL DEFIANT DISORDER [F91.3] 04/01/2008  . 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 Attention deficit hyperactivity disorder (ADHD) [F90.9] 03/30/2006  . RHINITIS, ALLERGIC [J30.9] 03/30/2006  . ASTHMA, PERSISTENT [J45.909] 03/30/2006   Total Time spent with patient: 20 minutes  Past Psychiatric History: See H&P  Past Medical History:  Past Medical History:  Diagnosis Date  . Asthma   . GERD  (gastroesophageal reflux disease)     Past Surgical History:  Procedure Laterality Date  . KIDNEY SURGERY     Family History: History reviewed. No pertinent family history. Family Psychiatric  History: See H&P Social History:  Social History   Substance and Sexual Activity  Alcohol Use No     Social History   Substance and Sexual Activity  Drug Use No    Social History   Socioeconomic History  . Marital status: Single    Spouse name: Not on file  . Number of children: Not on file  . Years of education: Not on file  . Highest education level: Not on file  Occupational History  . Not on file  Social Needs  . Financial resource strain: Not on file  . Food insecurity:    Worry: Not on file    Inability: Not on file  . Transportation needs:    Medical: Not on file    Non-medical: Not on file  Tobacco Use  . Smoking status: Never Smoker  . Smokeless tobacco: Never Used  Substance and Sexual Activity  . Alcohol use: No  . Drug use: No  . Sexual activity: Yes  Lifestyle  . Physical activity:    Days per week: Not on file    Minutes per session: Not on file  . Stress: Not on file  Relationships  . Social connections:    Talks on phone: Not on file    Gets together: Not on file    Attends religious service: Not on file    Active member of club or organization: Not on file    Attends meetings  of clubs or organizations: Not on file    Relationship status: Not on file  Other Topics Concern  . Not on file  Social History Narrative  . Not on file   Additional Social History:                         Sleep: Good  Appetite:  Good  Current Medications: Current Facility-Administered Medications  Medication Dose Route Frequency Provider Last Rate Last Dose  . acetaminophen (TYLENOL) tablet 650 mg  650 mg Oral Q6H PRN Nira Conn A, NP      . albuterol (PROVENTIL HFA;VENTOLIN HFA) 108 (90 Base) MCG/ACT inhaler 2 puff  2 puff Inhalation Q4H PRN Nira Conn A, NP      . alum & mag hydroxide-simeth (MAALOX/MYLANTA) 200-200-20 MG/5ML suspension 30 mL  30 mL Oral Q4H PRN Nira Conn A, NP      . citalopram (CELEXA) tablet 10 mg  10 mg Oral Daily Cobos, Rockey Situ, MD   10 mg at 06/18/17 0740  . hydrOXYzine (ATARAX/VISTARIL) tablet 25 mg  25 mg Oral TID PRN Jackelyn Poling, NP      . magnesium hydroxide (MILK OF MAGNESIA) suspension 30 mL  30 mL Oral Daily PRN Nira Conn A, NP      . traZODone (DESYREL) tablet 50 mg  50 mg Oral QHS PRN Jackelyn Poling, NP        Lab Results:  Results for orders placed or performed during the hospital encounter of 06/15/17 (from the past 48 hour(s))  TSH     Status: None   Collection Time: 06/17/17  7:19 AM  Result Value Ref Range   TSH 1.014 0.350 - 4.500 uIU/mL    Comment: Performed by a 3rd Generation assay with a functional sensitivity of <=0.01 uIU/mL. Performed at [facility], [address], [place], [place] [address]     Blood Alcohol level:  Lab Results  Component Value Date   ETH <10 06/15/2017    Metabolic Disorder Labs: No results found for: HGBA1C, MPG No results found for: PROLACTIN Lab Results  Component Value Date   CHOL 225 09/02/2008   TRIG 85 09/02/2008   HDL 80 09/02/2008   VLDL 17 09/02/2008   LDLCALC 128 09/02/2008    Physical Findings: AIMS: Facial and Oral Movements Muscles of Facial Expression: None, normal Lips and Perioral Area: None, normal Jaw: None, normal Tongue: None, normal,Extremity Movements Upper (arms, wrists, hands, fingers): None, normal Lower (legs, knees, ankles, toes): None, normal, Trunk Movements Neck, shoulders, hips: None, normal, Overall Severity Severity of abnormal movements (highest score from questions above): None, normal Incapacitation due to abnormal movements: None, normal Patient's awareness of abnormal movements (rate only patient's report): No Awareness, Dental Status Current problems with teeth and/or  dentures?: No Does patient usually wear dentures?: No  CIWA:    COWS:     Musculoskeletal: Strength & Muscle Tone: within normal limits Gait & Station: normal Patient leans: N/A  Psychiatric Specialty Exam: Physical Exam  Nursing note and vitals reviewed. Constitutional: He is oriented to person, place, and time. He appears well-developed and well-nourished.  Cardiovascular: Normal rate.  Respiratory: Effort normal.  Musculoskeletal: Normal range of motion.  Neurological: He is alert and oriented to person, place, and time.  Skin: Skin is warm.    Review of Systems  Constitutional: Negative.   HENT: Negative.   Eyes: Negative.   Respiratory: Negative.   Cardiovascular:  Negative.   Gastrointestinal: Negative.   Genitourinary: Negative.   Musculoskeletal: Negative.   Skin: Negative.   Neurological: Negative.   Endo/Heme/Allergies: Negative.   Psychiatric/Behavioral: Negative.     Blood pressure 115/78, pulse 70, temperature (!) 97.3 F (36.3 C), temperature source Oral, resp. rate 16, height  (1.753 m), weight 102.1 kg (225 lb).Body mass index is 33.23 kg/m.  General Appearance: Casual  Eye Contact:  Good  Speech:  Clear and Coherent and Normal Rate  Volume:  Normal  Mood:  Euthymic  Affect:  Appropriate  Thought Process:  Goal Directed and Descriptions of Associations: Intact  Orientation:  Full (Time, Place, and Person)  Thought Content:  WDL  Suicidal Thoughts:  No  Homicidal Thoughts:  No  Memory:  Immediate;   Good Recent;   Good Remote;   Good  Judgement:  Fair  Insight:  Good  Psychomotor Activity:  Normal  Concentration:  Concentration: Good and Attention Span: Good  Recall:  Good  Fund of Knowledge:  Good  Language:  Good  Akathisia:  No  Handed:  Right  AIMS (if indicated):     Assets:  Communication Skills Desire for Improvement Financial Resources/Insurance Housing Physical Health Social Support Transportation  ADL's:  Intact   Cognition:  WNL  Sleep:  Number of Hours: 6.25   Problems addressed MDD severe recurrent  Treatment Plan Summary: Daily contact with patient to assess and evaluate symptoms and progress in treatment, Medication management and Plan is to: -Continue Celexa 10 mg p.o. daily for mood stability -Continue Vistaril 25 mg 3 times daily as needed for anxiety -Continue Trazodone 50 mg p.o. nightly as needed for insomnia -Encourage group therapy participation -Potential discharge tomorrow  Maryfrances Bunnell, FNP 06/18/2017, 1:13 PM

## 2017-06-19 DIAGNOSIS — R45851 Suicidal ideations: Secondary | ICD-10-CM

## 2017-06-19 DIAGNOSIS — F332 Major depressive disorder, recurrent severe without psychotic features: Principal | ICD-10-CM

## 2017-06-19 MED ORDER — HYDROXYZINE HCL 25 MG PO TABS: 25.0000 mg | ORAL_TABLET | Freq: Three times a day (TID) | ORAL | 0 refills | Status: DC | PRN

## 2017-06-19 MED ORDER — CITALOPRAM HYDROBROMIDE 10 MG PO TABS: 10.0000 mg | ORAL_TABLET | Freq: Every day | ORAL | 0 refills | Status: DC

## 2017-06-19 MED ORDER — TRAZODONE HCL 50 MG PO TABS: 50.0000 mg | ORAL_TABLET | Freq: Every evening | ORAL | 0 refills | Status: DC | PRN

## 2017-06-19 NOTE — [facility] Suicide Risk Assessment (Signed)
[facility] Discharge Suicide Risk Assessment   Principal Problem: Severe recurrent major depression without psychotic features [facility]) Discharge Diagnoses:  Patient Active Problem List   Diagnosis Date Noted  . Severe recurrent major depression without psychotic features (HCC) [F33.2] 06/15/2017  . Routine screening for STI (sexually transmitted infection) [Z11.3] 02/16/2016  . Dysphagia, pharyngoesophageal phase [R13.14] 02/08/2010  . BIPOLAR DISORDER UNSPECIFIED [F31.9] 09/17/2008  . OPPOSITIONAL DEFIANT DISORDER [F91.3] 04/01/2008  . 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 Attention deficit hyperactivity disorder (ADHD) [F90.9] 03/30/2006  . RHINITIS, ALLERGIC [J30.9] 03/30/2006  . ASTHMA, PERSISTENT [J45.909] 03/30/2006    Total Time spent with patient: 30 minutes   Musculoskeletal: Strength & Muscle Tone: within normal limits Gait & Station: normal Patient leans: N/A  Psychiatric Specialty Exam: ROS denies headache, no chest pain, no shortness of breath, no vomiting  Blood pressure 115/78, pulse 70, temperature (!) 97.3 F (36.3 C), temperature source Oral, resp. rate 16, height  (1.753 m), weight 102.1 kg (225 lb).Body mass index is 33.23 kg/m.  General Appearance: Well Groomed  Eye Contact::  Good  Speech:  Normal Rate409  Volume:  Normal  Mood:  reports mood as improved and today presents euthymic  Affect:  Appropriate and Full Range  Thought Process:  Linear and Descriptions of Associations: Intact  Orientation:  Full (Time, Place, and Person)  Thought Content:  no hallucinations, no delusions   Suicidal Thoughts:  No denies suicidal or self injurious ideations,denies homicidal or violent ideations  Homicidal Thoughts:  No  Memory:  recent and remote grossly intact   Judgement:  Other:  improving   Insight:  improving   Psychomotor Activity:  Normal  Concentration:  Good  Recall:  Good  Fund of Knowledge:Good  Language: Good  Akathisia:   Negative  Handed:  Right  AIMS (if indicated):     Assets:  Communication Skills Desire for Improvement Resilience  Sleep:  Number of Hours: 5.75  Cognition: WNL  ADL's:  Intact   Mental Status Per Nursing Assessment::   On Admission:  NA  Demographic Factors:  22 year old single male, employed, lives with mother  Loss Factors: Does not identify any specific stressors, losses   Historical Factors: No prior psychiatric admissions,history of a suicide attempt at age 31, history of anxiety Risk Reduction Factors:   Sense of responsibility to family, Living with another person, especially a relative and Positive coping skills or problem solving skills  Continued Clinical Symptoms:  At this time patient is alert, attentive,well related, states he is feeling " better", and currently denies depression, and presents euthymic, with a full range of affect, no thought disorder, no suicidal or self injurious ideations, no homicidal or violent ideations, no hallucinations, no delusions, not internally preoccupied . On unit has been calm, behavior in good control, socializing with peers, cooperative on approach. States he is feeling significantly better, and states that he has been journaling and writing since he was admitted, which he states has been very helpful .  Tolerating medications well thus far - we reviewed side effect profile- including potential risk of increased suicidal ideations early in treatment with antidepressants in young adults .   Cognitive Features That Contribute To Risk:  No gross cognitive deficits noted upon discharge. Is alert , attentive, and oriented x 3   Suicide Risk:  Mild:  Suicidal ideation of limited frequency, intensity, duration, and specificity.  There are no identifiable plans, no associated intent, mild dysphoria and related symptoms,  good self-control (both objective and subjective assessment), few other risk factors, and identifiable protective factors,  including available and accessible social support.  Follow-up Information    [facility] Follow up on 06/19/2017.   Why:  Assessment for therapy services with [facility] on Tuesday, 5/28 at 1:00PM. Please drop by office at discharge to pay $20 copay and bring insurance card so they can make a copy. Office will email you paperwork that needs to be completed by first appt. Contact information: [address] [place] [address] [phone]        [facility] Follow up on 07/05/2017.   Specialty:  Behavioral Health Why:  Medication management/hospital follow-up appt on Wed, 07/05/17 at 11:00AM. Please arrive 30 mintues early to check in. Thank you.  Contact information: [address] [place] [address] [phone]           Plan Of Care/Follow-up recommendations:  Activity:  as tolerated Diet:  regular Tests:  NA Other:  See below  Patient is expressing readiness for discharge and is leaving unit in good spirits  Plans to return home Plans to follow up as above   Craige Cotta, MD 06/19/2017, 12:10 PM

## 2017-06-19 NOTE — Discharge Summary (Addendum)
Physician Discharge Summary Note  Patient:  Caleb Bartlett is an 22 y.o., male MRN:  [id number] DOB:  18-Jan-1996 Patient phone:  [phone] (home)  Patient address:   [address] [place] [address],  Total Time spent with patient: 45 minutes  Date of Admission:  06/15/2017 Date of Discharge: 06/19/2017  Reason for Admission:   "22 year old male , brought to ED by [employer]. States he was standing on a bridge during his usual morning walk: "I was leaning on rail", and states he had been contemplating jumping off, but states " I don't think I would have done it ". He does not elaborate why he felt suicidal and does not endorse any particular stressor or contributing factor. He explains that he suffers from significant anxiety and that at times anxiety becomes more severe, subjectively overwhelming, leading to suicidal thoughts .  States that police arrived on scene and brought him to hospital. He does not know who called police. "  He reports history of depression, states he had suicidal ideations on day of admission, but not before then. However, chart notes indicate mother stated that he had recently been writing suicide notes . He states " my depressions come in phases, I can feel depressed for a few hours and then I'm OK again". Of note, he does not endorse any significant neuro-vegetative symptoms of depression, and states appetite, sleep, energy level have all been normal. In addition to recent suicidal ideations, he endorses history of anxiety, and describes history of excessive generalized worry, and also  history of agoraphobia, some panic attacks"  Principal Problem: Severe recurrent major depression without psychotic features [facility]) Discharge Diagnoses: Patient Active Problem List   Diagnosis Date Noted  . Suicidal ideations [R45.851]   . Severe recurrent major depression without psychotic features (HCC) [F33.2] 06/15/2017  . Routine screening for STI (sexually  transmitted infection) [Z11.3] 02/16/2016  . Dysphagia, pharyngoesophageal phase [R13.14] 02/08/2010  . BIPOLAR DISORDER UNSPECIFIED [F31.9] 09/17/2008  . OPPOSITIONAL DEFIANT DISORDER [F91.3] 04/01/2008  . 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 Attention deficit hyperactivity disorder (ADHD) [F90.9] 03/30/2006  . RHINITIS, ALLERGIC [J30.9] 03/30/2006  . ASTHMA, PERSISTENT [J45.909] 03/30/2006    Past Psychiatric History: see H&P  Past Medical History:  Past Medical History:  Diagnosis Date  . Asthma   . GERD (gastroesophageal reflux disease)     Past Surgical History:  Procedure Laterality Date  . KIDNEY SURGERY     Family History: History reviewed. No pertinent family history. Family Psychiatric  History: see H&P Social History:  Social History   Substance and Sexual Activity  Alcohol Use No     Social History   Substance and Sexual Activity  Drug Use No    Social History   Socioeconomic History  . Marital status: Single    Spouse name: Not on file  . Number of children: Not on file  . Years of education: Not on file  . Highest education level: Not on file  Occupational History  . Not on file  Social Needs  . Financial resource strain: Not on file  . Food insecurity:    Worry: Not on file    Inability: Not on file  . Transportation needs:    Medical: Not on file    Non-medical: Not on file  Tobacco Use  . Smoking status: Never Smoker  . Smokeless tobacco: Never Used  Substance and Sexual Activity  . Alcohol use: No  . Drug  use: No  . Sexual activity: Yes  Lifestyle  . Physical activity:    Days per week: Not on file    Minutes per session: Not on file  . Stress: Not on file  Relationships  . Social connections:    Talks on phone: Not on file    Gets together: Not on file    Attends religious service: Not on file    Active member of club or organization: Not on file    Attends meetings of clubs or organizations: Not on  file    Relationship status: Not on file  Other Topics Concern  . Not on file  Social History Narrative  . Not on file    Hospital Course:   Caleb Bartlett was admitted for Severe recurrent major depression without psychotic features [facility]) , with psychosis and crisis management.  Pt was treated discharged with the medications listed below under Medication List.  Medical problems were identified and treated as needed.  Home medications were restarted as appropriate.  Improvement was monitored by observation and Caleb Bartlett 's daily report of symptom reduction.  Emotional and mental status was monitored by daily self-inventory reports completed by Caleb Bartlett and clinical staff.         Caleb Bartlett was evaluated by the treatment team for stability and plans for continued recovery upon discharge. Caleb Bartlett 's motivation was an integral factor for scheduling further treatment. Employment, transportation, bed availability, health status, family support, and any pending legal issues were also considered during hospital stay. Pt was offered further treatment options upon discharge including but not limited to Residential, Intensive Outpatient, and Outpatient treatment.  Caleb Bartlett will follow up with the services as listed below under Follow Up Information.     Upon completion of this admission the patient was both mentally and medically stable for discharge denying suicidal/homicidal ideation, auditory/visual/tactile hallucinations, delusional thoughts and paranoia.    Family session went well. No seclusion or restraint.  Caleb Bartlett responded well to treatment with celexa, vistaril, trazodone, Abilify, and Focalin without adverse effects. Pt demonstrated improvement without reported or observed adverse effects to the point of stability appropriate for outpatient management. Pertinent labs include: UDS + THC. Reviewed CBC, CMP, BAL,  and UDS; all unremarkable aside from noted exceptions.    Physical Findings: AIMS: Facial and Oral Movements Muscles of Facial Expression: None, normal Lips and Perioral Area: None, normal Jaw: None, normal Tongue: None, normal,Extremity Movements Upper (arms, wrists, hands, fingers): None, normal Lower (legs, knees, ankles, toes): None, normal, Trunk Movements Neck, shoulders, hips: None, normal, Overall Severity Severity of abnormal movements (highest score from questions above): None, normal Incapacitation due to abnormal movements: None, normal Patient's awareness of abnormal movements (rate only patient's report): No Awareness, Dental Status Current problems with teeth and/or dentures?: No Does patient usually wear dentures?: No  CIWA:    COWS:     Musculoskeletal: Strength & Muscle Tone: within normal limits Gait & Station: normal Patient leans: N/A  Psychiatric Specialty Exam: Physical Exam  Review of Systems  Psychiatric/Behavioral: Positive for depression and substance abuse. Negative for hallucinations and suicidal ideas. The patient is nervous/anxious and has insomnia.   All other systems reviewed and are negative.   Blood pressure 115/78, pulse 70, temperature (!) 97.3 F (36.3 C), temperature source Oral, resp. rate 16, height  (1.753 m), weight 102.1 kg (225 lb).Body mass index is 33.23 kg/m.  SEE MD PSE WITHIN SRA  Have you used any form of tobacco in the last 30 days? (Cigarettes, Smokeless Tobacco, Cigars, and/or Pipes): No  Has this patient used any form of tobacco in the last 30 days? (Cigarettes, Smokeless Tobacco, Cigars, and/or Pipes) NO Blood Alcohol level:  Lab Results  Component Value Date   ETH <10 06/15/2017    Metabolic Disorder Labs:  No results found for: HGBA1C, MPG No results found for: PROLACTIN Lab Results  Component Value Date   CHOL 225 09/02/2008   TRIG 85 09/02/2008   HDL 80 09/02/2008   VLDL 17 09/02/2008   LDLCALC 128  09/02/2008    See Psychiatric Specialty Exam and Suicide Risk Assessment completed by Attending Physician prior to discharge.  Discharge destination:  Home  Is patient on multiple antipsychotic therapies at discharge:  No   Has Patient had three or more failed trials of antipsychotic monotherapy by history:  No  Recommended Plan for Multiple Antipsychotic Therapies: NA  Discharge Instructions    Diet - low sodium heart healthy   Complete by:  As directed    Diet - low sodium heart healthy   Complete by:  As directed    Discharge instructions   Complete by:  As directed    Take all medications as prescribed. Keep all follow-up appointments as scheduled.  Do not consume alcohol or use illegal drugs while on prescription medications. Report any adverse effects from your medications to your primary care provider promptly.  In the event of recurrent symptoms or worsening symptoms, call 911, a crisis hotline, or go to the nearest emergency department for evaluation.   Discharge instructions   Complete by:  As directed    Take all medications as prescribed. Keep all follow-up appointments as scheduled.  Do not consume alcohol or use illegal drugs while on prescription medications. Report any adverse effects from your medications to your primary care provider promptly.  In the event of recurrent symptoms or worsening symptoms, call 911, a crisis hotline, or go to the nearest emergency department for evaluation.   Increase activity slowly   Complete by:  As directed    Increase activity slowly   Complete by:  As directed      Allergies as of 06/19/2017   No Known Allergies     Medication List    STOP taking these medications   ARIPiprazole 5 MG tablet Commonly known as:  ABILIFY   beclomethasone 40 MCG/ACT inhaler Commonly known as:  QVAR   dexmethylphenidate 10 MG tablet Commonly known as:  FOCALIN     TAKE these medications     Indication  citalopram 10 MG  tablet Commonly known as:  CELEXA Take 1 tablet (10 mg total) by mouth daily. Start taking on:  06/20/2017  Indication:  Depression   hydrOXYzine 25 MG tablet Commonly known as:  ATARAX/VISTARIL Take 1 tablet (25 mg total) by mouth 3 (three) times daily as needed for anxiety.  Indication:  Feeling Anxious   PROVENTIL 90 MCG/ACT inhaler Generic drug:  albuterol Inhale 2 puffs into the lungs every 4 (four) hours as needed for shortness of breath.  Indication:  Asthma   traZODone 50 MG tablet Commonly known as:  DESYREL Take 1 tablet (50 mg total) by mouth at bedtime as needed for sleep.  Indication:  Trouble Sleeping      Follow-up Information    [facility] Follow up on 06/19/2017.   Why:  Assessment for therapy services with [facility] on Tuesday, 5/28 at  1:00PM. Please drop by office at discharge to pay $20 copay and bring insurance card so they can make a copy. Office will email you paperwork that needs to be completed by first appt. Contact information: [address] [place] [address] [phone]        [facility] Follow up on 07/05/2017.   Specialty:  Behavioral Health Why:  Medication management/hospital follow-up appt on Wed, 07/05/17 at 11:00AM. Please arrive 30 mintues early to check in. Thank you.  Contact information: [address] [place] [address] [phone]           Follow-up recommendations:  Activity:  As tolerated  Comments:   Take all medications as prescribed. Keep all follow-up appointments as scheduled.  Do not consume alcohol or use illegal drugs while on prescription medications. Report any adverse effects from your medications to your primary care provider promptly.  In the event of recurrent symptoms or worsening symptoms, call 911, a crisis hotline, or go to the nearest emergency department for evaluation.    Signed: Beau Fanny, FNP 06/19/2017, 1:18 PM \  Patient seen, Suicide  Assessment Completed.  Disposition Plan Reviewed

## 2017-06-19 NOTE — Plan of Care (Signed)
D: Pt denies SI/HI/AV hallucinations. Pt is pleasant and cooperative. Pt observed in milieu interacting with peers. Patient goal for today was to work on coping skills for depression. A: Pt was offered support and encouragement. Pt was given scheduled medications. Pt was encourage to attend groups. Q 15 minute checks were done for safety.  R:Pt attends groups and interacts well with peers and staff. Pt is taking medication. Pt has no complaints.Pt receptive to treatment and safety maintained on unit.

## 2017-06-19 NOTE — Plan of Care (Signed)
  Problem: Safety: Goal: Periods of time without injury will increase Outcome: Progressing Note:  Patient denies SI and remains safe on unit.

## 2017-06-19 NOTE — Progress Notes (Signed)
  [facility] Adult Case Management Discharge Plan :  Will you be returning to the same living situation after discharge:  Yes,  home At discharge, do you have transportation home?: Yes,  mother Do you have the ability to pay for your medications: Yes,  [employer]  Release of information consent forms completed and submitted to medical records by CSW.  Patient to Follow up at: Follow-up Information    [facility] Follow up on 06/19/2017.   Why:  Assessment for therapy services with [facility] on Tuesday, 5/28 at 1:00PM. Please drop by office at discharge to pay $20 copay and bring insurance card so they can make a copy. Office will email you paperwork that needs to be completed by first appt. Contact information: [address] [place] [address] [phone]        [facility] [place] [facility] Follow up on 07/05/2017.   Specialty:  Behavioral Health Why:  Medication management/hospital follow-up appt on Wed, 07/05/17 at 11:00AM. Please arrive 30 mintues early to check in. Thank you.  Contact information: [address] [place] [address] [phone]           Next level of care provider has access to [facility] Link:no  Safety Planning and Suicide Prevention discussed: Yes,  SPE completed with both pt and his mother. SPI pamphlet and Mobile Crisis information provided to pt.   Have you used any form of tobacco in the last 30 days? (Cigarettes, Smokeless Tobacco, Cigars, and/or Pipes): No  Has patient been referred to the Quitline?: N/A patient is not a smoker  Patient has been referred for addiction treatment: Yes  [employer], LCSW 06/19/2017, 9:30 AM

## 2017-08-06 ENCOUNTER — Emergency Department (HOSPITAL_COMMUNITY)
Admission: EM | Admit: 2017-08-06 | Discharge: 2017-08-07 | Disposition: A | Discharge status: Home / Self Care | Payer: Managed Care, Other (non HMO) | Attending: Emergency Medicine | Admitting: Emergency Medicine

## 2017-08-06 ENCOUNTER — Encounter (HOSPITAL_COMMUNITY): Payer: Self-pay | Admitting: *Deleted

## 2017-08-06 ENCOUNTER — Other Ambulatory Visit: Payer: Self-pay

## 2017-08-06 DIAGNOSIS — F319 Bipolar disorder, unspecified: Secondary | ICD-10-CM | POA: Diagnosis present

## 2017-08-06 DIAGNOSIS — F909 Attention-deficit hyperactivity disorder, unspecified type: Secondary | ICD-10-CM | POA: Diagnosis not present

## 2017-08-06 DIAGNOSIS — F3130 Bipolar disorder, current episode depressed, mild or moderate severity, unspecified: Secondary | ICD-10-CM | POA: Diagnosis not present

## 2017-08-06 DIAGNOSIS — J45909 Unspecified asthma, uncomplicated: Secondary | ICD-10-CM | POA: Diagnosis not present

## 2017-08-06 DIAGNOSIS — R451 Restlessness and agitation: Secondary | ICD-10-CM | POA: Diagnosis not present

## 2017-08-06 DIAGNOSIS — F332 Major depressive disorder, recurrent severe without psychotic features: Secondary | ICD-10-CM | POA: Insufficient documentation

## 2017-08-06 DIAGNOSIS — F913 Oppositional defiant disorder: Secondary | ICD-10-CM | POA: Diagnosis not present

## 2017-08-06 DIAGNOSIS — R45851 Suicidal ideations: Secondary | ICD-10-CM | POA: Insufficient documentation

## 2017-08-06 DIAGNOSIS — Z79899 Other long term (current) drug therapy: Secondary | ICD-10-CM | POA: Insufficient documentation

## 2017-08-06 HISTORY — DX: Other symptoms and signs involving appearance and behavior: R46.89

## 2017-08-06 HISTORY — DX: Oppositional defiant disorder: F91.3

## 2017-08-06 HISTORY — DX: Bipolar disorder, unspecified: F31.9

## 2017-08-06 HISTORY — DX: Attention-deficit hyperactivity disorder, unspecified type: F90.9

## 2017-08-06 HISTORY — DX: Unspecified sexually transmitted disease: A64

## 2017-08-06 LAB — COMPREHENSIVE METABOLIC PANEL
ALBUMIN: 4 g/dL (ref 3.5–5.0)
ALK PHOS: 81 U/L (ref 38–126)
ALT: 36 U/L (ref 0–44)
AST: 29 U/L (ref 15–41)
Anion gap: 10 (ref 5–15)
BUN: 11 mg/dL (ref 6–20)
CALCIUM: 9.5 mg/dL (ref 8.9–10.3)
CHLORIDE: 106 mmol/L (ref 98–111)
CO2: 26 mmol/L (ref 22–32)
Creatinine, Ser: 1.16 mg/dL (ref 0.61–1.24)
GFR calc non Af Amer: >60 mL/min (ref >60)
GLUCOSE: 89 mg/dL (ref 70–99)
POTASSIUM: 3.7 mmol/L (ref 3.5–5.1)
Sodium: 142 mmol/L (ref 135–145)
Total Bilirubin: 0.8 mg/dL (ref 0.3–1.2)
Total Protein: 7.1 g/dL (ref 6.5–8.1)

## 2017-08-06 LAB — CBC
HCT: 47.1 % (ref 39.0–52.0)
Hemoglobin: 15.6 g/dL (ref 13.0–17.0)
MCH: 26.4 pg (ref 26.0–34.0)
MCHC: 33.1 g/dL (ref 30.0–36.0)
MCV: 79.8 fL (ref 78.0–100.0)
Platelets: 222 10*3/uL (ref 150–400)
RBC: 5.9 MIL/uL — ABNORMAL HIGH (ref 4.22–5.81)
RDW: 13.6 % (ref 11.5–15.5)
WBC: 4.9 10*3/uL (ref 4.0–10.5)

## 2017-08-06 LAB — SALICYLATE LEVEL

## 2017-08-06 LAB — ETHANOL: Alcohol, Ethyl (B): <10 mg/dL (ref <10)

## 2017-08-06 LAB — ACETAMINOPHEN LEVEL

## 2017-08-06 MED ORDER — HYDROXYZINE HCL 25 MG PO TABS: 25.0000 mg | ORAL_TABLET | Freq: Three times a day (TID) | ORAL | Status: DC | PRN

## 2017-08-06 MED ORDER — PROVENTIL 90 MCG/ACT IN AERS: 2.0000 | INHALATION_SPRAY | RESPIRATORY_TRACT | Status: DC | PRN

## 2017-08-06 MED ORDER — ACETAMINOPHEN 325 MG PO TABS: 650.0000 mg | ORAL_TABLET | ORAL | Status: DC | PRN

## 2017-08-06 MED ORDER — HALOPERIDOL LACTATE 5 MG/ML IJ SOLN
10.0000 mg | Freq: Once | INTRAMUSCULAR | Status: AC
  Administered 2017-08-06: 10 mg via INTRAMUSCULAR
  Filled 2017-08-06: qty 2

## 2017-08-06 MED ORDER — DIPHENHYDRAMINE HCL 50 MG/ML IJ SOLN
25.0000 mg | Freq: Once | INTRAMUSCULAR | Status: AC
  Administered 2017-08-06: 25 mg via INTRAMUSCULAR
  Filled 2017-08-06: qty 1

## 2017-08-06 MED ORDER — LORAZEPAM 2 MG/ML IJ SOLN
2.0000 mg | Freq: Once | INTRAMUSCULAR | Status: AC
  Administered 2017-08-06: 2 mg via INTRAMUSCULAR
  Filled 2017-08-06: qty 1

## 2017-08-06 MED ORDER — ALBUTEROL SULFATE HFA 108 (90 BASE) MCG/ACT IN AERS: 2.0000 | INHALATION_SPRAY | RESPIRATORY_TRACT | Status: DC | PRN

## 2017-08-06 NOTE — ED Triage Notes (Addendum)
EMS reports pt is suicidal and had a bunch of pills when his mother found him. She called [employer] who called EMS. Pt also has several plans to kill self. When [employer] tried to transport he started to bang head against glass, he was calm with EMS although does not want to be touched. He reused VS

## 2017-08-06 NOTE — ED Notes (Signed)
Pt stated "I tried to overdose on my meds."

## 2017-08-06 NOTE — ED Notes (Signed)
Bed: WA27 Expected date:  Expected time:  Means of arrival:  Comments: 22 yo IVC

## 2017-08-06 NOTE — ED Notes (Signed)
IVC paperwork has arrived, also updated [place] regarding arrival of paperwork and pt will not allow writer to get VS or start orders.

## 2017-08-06 NOTE — ED Notes (Signed)
Pt refusing all care, he remains handcuffed to the bed with [employer] present, [employer] verified mother is in process of taking out IVC paperwork. Upon receiving paperwork, further evaluation will continue

## 2017-08-06 NOTE — ED Notes (Signed)
Pt allowed writer to obtain labs, due to cooperative behavior, one handcuff removed

## 2017-08-06 NOTE — ED Provider Notes (Signed)
[facility]-EMERGENCY DEPT Provider Note   CSN: [id number] Arrival date & time: 08/06/17  1251     History   Chief Complaint Chief Complaint  Patient presents with  . Medical Clearance  . Suicidal    HPI Caleb Bartlett is a 22 y.o. male.  Caleb Bartlett is a 22 y.o. Male with a history of asthma, GERD, severe recurrent depression and bipolar disorder, who presents to the emergency department under IVC for suicidal ideations with plan.  Patient's mother found him at home with a bunch of pills he had not yet ingested.  Patient endorses suicidal ideations, and he reports he had a panic attack last night and felt like he was going to die, but he did not call anything because that would have been fine with him, this morning he felt like he should have died so he was planning to take all of his mood stabilizers and also had a knife with him and attempt to harm himself, but [employer] arrived and took medications and knife away from patient before he could harm himself.  Per [employer] patient was repeatedly banging his head against the glass of the Car while in transit.  He continues to endorse suicidal ideations with multiple plans.  Is throughout encounter patient is repeatedly asking when he can leave here today attempted to explain to patient that he is here under IVC and will not be able to leave at this time at which point he became very upset and refused to cooperate or talk anymore with me.  Patient did report that he has dealt with depression and anxiety for a long time and is on several medications and has required psychiatric hospitalization in the past and has attempted to kill himself in the past.  Patient denies any focal medical complaints no chest pain, shortness of breath, abdominal pain, headaches, vision changes, fevers or chills.  Pt is refusing to cooperate with staff and will not allow vital signs or lab work.  Per [employer] officer, patient reported to him that he  feels like on his good days he is Caleb Bartlett and on his bad days he is somewhere else insinuating some sort of internal struggle, patient was intermittently cooperative and then would become acutely agitated.  Patient feels like none of his medications are working in the just making things worse.     Past Medical History:  Diagnosis Date  . ADHD   . Asthma   . Bipolar 1 disorder (HCC)   . GERD (gastroesophageal reflux disease)   . Oppositional defiant behavior   . STI (sexually transmitted infection)     Patient Active Problem List   Diagnosis Date Noted  . Suicidal ideations   . Severe recurrent major depression without psychotic features (HCC) 06/15/2017  . Routine screening for STI (sexually transmitted infection) 02/16/2016  . Dysphagia, pharyngoesophageal phase 02/08/2010  . BIPOLAR DISORDER UNSPECIFIED 09/17/2008  . OPPOSITIONAL DEFIANT DISORDER 04/01/2008  . 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 Attention deficit hyperactivity disorder (ADHD) 03/30/2006  . RHINITIS, ALLERGIC 03/30/2006  . ASTHMA, PERSISTENT 03/30/2006    Past Surgical History:  Procedure Laterality Date  . KIDNEY SURGERY          Home Medications    Prior to Admission medications   Medication Sig Start Date End Date Taking? Authorizing Provider  albuterol (PROVENTIL) 90 MCG/ACT inhaler Inhale 2 puffs into the lungs every 4 (four) hours as needed for shortness of breath.    Yes  [provider]  citalopram (CELEXA) 10 MG tablet Take 1 tablet (10 mg total) by mouth daily. 06/20/17  Yes Oneta Rack, NP  eszopiclone (LUNESTA) 2 MG TABS tablet Take 2 mg by mouth at bedtime as needed for sleep. Take immediately before bedtime   Yes [provider]  hydrOXYzine (ATARAX/VISTARIL) 25 MG tablet Take 1 tablet (25 mg total) by mouth 3 (three) times daily as needed for anxiety. 06/19/17  Yes Oneta Rack, NP  lamoTRIgine (LAMICTAL) 25 MG tablet Take 25 mg by mouth See admin  instructions. TAKE 2 TABLETS BY MOUTH ONCE DAILY FOR 5 DAYS THEN 3 TABS ONCE DAILY FOR 5 DAYS THEN 4 TABS ONCE DAILY STARTING 07/03 08/02/17  Yes [provider]  traZODone (DESYREL) 50 MG tablet Take 1 tablet (50 mg total) by mouth at bedtime as needed for sleep. 06/19/17  Yes Oneta Rack, NP    Family History No family history on file.  Social History Social History   Tobacco Use  . Smoking status: Never Smoker  . Smokeless tobacco: Never Used  Substance Use Topics  . Alcohol use: No  . Drug use: No     Allergies   Patient has no known allergies.   Review of Systems Review of Systems  Constitutional: Negative for chills and fever.  HENT: Negative for congestion, rhinorrhea and sore throat.   Eyes: Negative for visual disturbance.  Respiratory: Negative for cough and shortness of breath.   Cardiovascular: Negative for chest pain.  Gastrointestinal: Negative for abdominal pain, diarrhea, nausea and vomiting.  Genitourinary: Negative for dysuria and frequency.  Musculoskeletal: Negative for arthralgias and myalgias.  Skin: Negative for color change and rash.  Neurological: Negative for dizziness, syncope and light-headedness.  Psychiatric/Behavioral: Positive for agitation, dysphoric mood, self-injury and suicidal ideas. The patient is nervous/anxious.      Physical Exam Updated Vital Signs BP 114/62 (BP Location: Right Arm)   Pulse 62   Temp 98.2 F (36.8 C) (Oral)   Resp 14   SpO2 100%   Physical Exam  Constitutional: He is oriented to person, place, and time. He appears well-developed and well-nourished. No distress.  Patient initially uncooperative, repeatedly banging his handcuffs on the side of the bed and unwilling to talk with me  HENT:  Head: Normocephalic and atraumatic.  Mouth/Throat: Oropharynx is clear and moist.  Eyes: EOM are normal. Right eye exhibits no discharge. Left eye exhibits no discharge.  Neck: Neck supple.  Cardiovascular:  Normal rate, regular rhythm, normal heart sounds and intact distal pulses.  Pulmonary/Chest: Effort normal and breath sounds normal. No stridor. No respiratory distress. He has no wheezes. He has no rales.  Respirations equal and unlabored, patient able to speak in full sentences, lungs clear to auscultation bilaterally  Abdominal: Soft. Bowel sounds are normal. He exhibits no distension and no mass. There is no tenderness. There is no guarding.  Abdomen soft, nondistended, nontender to palpation in all quadrants without guarding or peritoneal signs  Musculoskeletal: He exhibits no edema or deformity.  Neurological: He is alert and oriented to person, place, and time. Coordination normal.  Moving all extremities without difficulty  Skin: Skin is warm and dry. Capillary refill takes less than 2 seconds. He is not diaphoretic.  Psychiatric: His speech is normal. His affect is labile. He is agitated. He is not actively hallucinating. Cognition and memory are normal. He expresses impulsivity. He expresses suicidal ideation. He expresses no homicidal ideation. He expresses suicidal plans. He expresses  no homicidal plans.  Nursing note and vitals reviewed.    ED Treatments / Results  Labs (all labs ordered are listed, but only abnormal results are displayed) Labs Reviewed  ACETAMINOPHEN LEVEL - Abnormal; Notable for the following components:      Result Value   Acetaminophen (Tylenol), Serum <10 (*)    All other components within normal limits  CBC - Abnormal; Notable for the following components:   RBC 5.90 (*)    All other components within normal limits  COMPREHENSIVE METABOLIC PANEL  ETHANOL  SALICYLATE LEVEL  RAPID URINE DRUG SCREEN, HOSP PERFORMED    EKG None  Radiology No results found.  Procedures Procedures (including critical care time)  Medications Ordered in ED Medications  hydrOXYzine (ATARAX/VISTARIL) tablet 25 mg (has no administration in time range)  acetaminophen  (TYLENOL) tablet 650 mg (has no administration in time range)  albuterol (PROVENTIL HFA;VENTOLIN HFA) 108 (90 Base) MCG/ACT inhaler 2 puff (has no administration in time range)  haloperidol lactate (HALDOL) injection 10 mg (10 mg Intramuscular Given 08/06/17 1353)  LORazepam (ATIVAN) injection 2 mg (2 mg Intramuscular Given 08/06/17 1353)  diphenhydrAMINE (BENADRYL) injection 25 mg (25 mg Intramuscular Given 08/06/17 1354)     Initial Impression / Assessment and Plan / ED Course  I have reviewed the triage vital signs and the nursing notes.  Pertinent labs & imaging results that were available during my care of the patient were reviewed by me and considered in my medical decision making (see chart for details).  Patient presents under IVC for suicidal ideations with plans to overdose or cut himself, he also endorsed several other potential plans.  Patient with long history of severe depression multiple suicide attempts in the past and prior psychiatric hospitalizations.  He was found by his mother with a pill bottle and knife but had not ingested any of the medication yet and was stopped by police.  Patient initially cooperative but then became agitated and was kicking and swinging at staff, refusing any lab work or vitals.  Patient had to be medicated with Haldol, Ativan and Benadryl, will calm and cooperative.  Patient denies any focal medical complaints, he has normal vitals and no focal findings on exam.  Medical screening labs and EKG obtained.  Screening labs unremarkable EKG with no concerning changes.  At this time patient is medically cleared for psychiatric elevation, TTS consult placed and patient put under ED psych hold.  Awaiting TTS recommendations for appropriate disposition.  Final Clinical Impressions(s) / ED Diagnoses   Final diagnoses:  Suicidal ideation  Agitation    ED Discharge Orders    None       Legrand RamsFord, Kelsey N, PA-C 08/06/17 1744    Azalia Bilisampos, Kevin, MD 08/07/17  [phone]

## 2017-08-06 NOTE — ED Notes (Addendum)
Pt changed into paper scrubs, wanded by Security.  Pt unable to remove bracelet on right wrist; lock won't release.  Pt encouraged to provide urine specimen.  Pt stated "I can't right now."

## 2017-08-06 NOTE — ED Notes (Signed)
Meds given, pt allowed nurse to administer without problems

## 2017-08-06 NOTE — BH Assessment (Signed)
Assessment Note  Caleb Bartlett is an 22 y.o. male.  -Clinician reviewed note by Jodi Geralds, PA.  Patient was IVC'ed by family members after he was found at home with pills that he had not yet ingested.  He says he had a panic attack last night and felt suicidal.  This morning he woke up still feeling like he should die so he was going to take all of his medications and use a knife to harm himself.  [employer] was called and they took the knife and medications away.  While in transit to hospital, patient kept banging head on the glass.  He continued to endorse multiple plans to harm himself to [place].  He refused to cooperate when he was told that he was under IVC.  He reports that he takes multiple medications for depression & anxiety but feels they do not help.    Patient now denies feeling suicidal.  He does say that he takes his medications.  He says a "Dr. Sherrine Maples (didn't know last name)" sees him in [place].  He says that he is his primary care doctor.  Patient was at [facility] May 16-20 of this year.  Patient cannot identify any stressors that would be depressing him.  He does say he has a "gun charge" and a court date of July 16.  Patient is still drowsy during assessment.  He does not answer questions thoroughly.  He does say he has been depressed and anxious lately.  -Clinician discussed patient with Donell Sievert, PA who recommends patient be reviewed by psychiatry in AM to complete 1st opinion.  Diagnosis: F33.2 MDD recurrent, severe; F91.3 Oppositional Defiant D/O;   Past Medical History:  Past Medical History:  Diagnosis Date  . ADHD   . Asthma   . Bipolar 1 disorder (HCC)   . GERD (gastroesophageal reflux disease)   . Oppositional defiant behavior   . STI (sexually transmitted infection)     Past Surgical History:  Procedure Laterality Date  . KIDNEY SURGERY      Family History: No family history on file.  Social History:  reports that he has never smoked. He has never used  smokeless tobacco. He reports that he does not drink alcohol or use drugs.  Additional Social History:     CIWA: CIWA-Ar BP: 114/62 Pulse Rate: 62 COWS:    Allergies: No Known Allergies  Home Medications:  (Not in a hospital admission)  OB/GYN Status:  No LMP for male patient.  General Assessment Data Assessment unable to be completed: Yes Reason for not completing assessment: Pt admin'd Haldol & Ativan due to resistance Location of Assessment: [facility] ED TTS Assessment: In system Is this a Tele or Face-to-Face Assessment?: Face-to-Face Is this an Initial Assessment or a Re-assessment for this encounter?: Initial Assessment Marital status: Single Is patient pregnant?: No Pregnancy Status: No Living Arrangements: Parent Can pt return to current living arrangement?: Yes Admission Status: Involuntary Is patient capable of signing voluntary admission?: No Referral Source: Self/Family/Friend Insurance type: [employer]     Crisis Care Plan Living Arrangements: Parent Name of Psychiatrist: none at present(He says a "Dr. Sherrine Maples" is his regular doctor and prescribes.) Name of Therapist: none at present  Education Status Is patient currently in school?: No Is the patient employed, unemployed or receiving disability?: Employed(May still be employed at [employer])  Risk to self with the past 6 months Suicidal Ideation: Yes-Currently Present(Pt denies at this time.) Has patient been a risk to self within the past  6 months prior to admission? : Yes Suicidal Intent: Yes-Currently Present(Pt denies at this moment.) Has patient had any suicidal intent within the past 6 months prior to admission? : Yes Is patient at risk for suicide?: Yes Suicidal Plan?: Yes-Currently Present Has patient had any suicidal plan within the past 6 months prior to admission? : Yes Specify Current Suicidal Plan: Overdose on medications. Access to Means: Yes Specify Access to Suicidal Means: Medications at home. What  has been your use of drugs/alcohol within the last 12 months?: THC Previous Attempts/Gestures: Yes How many times?: 2 Other Self Harm Risks: None Triggers for Past Attempts: Unpredictable, Other (Comment)(Depression) Intentional Self Injurious Behavior: None Family Suicide History: Unknown Recent stressful life event(s): Legal Issues(Has a gun charge.  court on 08/15/17.) Persecutory voices/beliefs?: No Depression: Yes Depression Symptoms: Despondent, Insomnia, Tearfulness, Isolating, Guilt, Loss of interest in usual pleasures, Feeling worthless/self pity Substance abuse history and/or treatment for substance abuse?: Yes Suicide prevention information given to non-admitted patients: Not applicable  Risk to Others within the past 6 months Homicidal Ideation: No Does patient have any lifetime risk of violence toward others beyond the six months prior to admission? : No Thoughts of Harm to Others: No Current Homicidal Intent: No Current Homicidal Plan: No Access to Homicidal Means: No Identified Victim: No one History of harm to others?: No Assessment of Violence: None Noted Violent Behavior Description: None reported Does patient have access to weapons?: No(Denies but has a gun charge and a court date.) Criminal Charges Pending?: Yes Describe Pending Criminal Charges: Firearm possession Does patient have a court date: Yes Court Date: 08/15/17 Is patient on probation?: No  Psychosis Hallucinations: None noted Delusions: None noted  Mental Status Report Appearance/Hygiene: Unremarkable, In scrubs, Disheveled Eye Contact: Poor Motor Activity: Freedom of movement, Unremarkable Speech: Logical/coherent Level of Consciousness: Drowsy Mood: Depressed, Helpless, Sad Affect: Depressed, Blunted Anxiety Level: Minimal Thought Processes: Coherent, Relevant Judgement: Impaired Orientation: Appropriate for developmental age Obsessive Compulsive Thoughts/Behaviors: None  Cognitive  Functioning Concentration: Decreased Memory: Recent Intact, Remote Intact Is patient IDD: No Is patient DD?: No Insight: Fair Impulse Control: Poor Appetite: Fair Have you had any weight changes? : No Change Sleep: Decreased Total Hours of Sleep: (<4H/D) Vegetative Symptoms: None  ADLScreening [facility] Assessment Services) Patient's cognitive ability adequate to safely complete daily activities?: Yes Patient able to express need for assistance with ADLs?: Yes Independently performs ADLs?: Yes (appropriate for developmental age)  Prior Inpatient Therapy Prior Inpatient Therapy: Yes Prior Therapy Dates: May 16-20, 2019 Prior Therapy Facilty/Provider(s): [facility] Reason for Treatment: SI  Prior Outpatient Therapy Prior Outpatient Therapy: Yes Prior Therapy Dates: current Prior Therapy Facilty/Provider(s): Dr. Sherrine Maples?  may be his primary care doctor Reason for Treatment: Medication Does patient have an ACCT team?: No Does patient have Intensive In-House Services?  : No Does patient have [facility] services? : Unknown Does patient have P4CC services?: No  ADL Screening (condition at time of admission) Patient's cognitive ability adequate to safely complete daily activities?: Yes Is the patient deaf or have difficulty hearing?: No Does the patient have difficulty seeing, even when wearing glasses/contacts?: No Does the patient have difficulty concentrating, remembering, or making decisions?: No Patient able to express need for assistance with ADLs?: Yes Does the patient have difficulty dressing or bathing?: No Independently performs ADLs?: Yes (appropriate for developmental age) Does the patient have difficulty walking or climbing stairs?: No Weakness of Legs: None Weakness of Arms/Hands: None       Abuse/Neglect Assessment (Assessment to  be complete while patient is alone) Physical Abuse: Denies Verbal Abuse: Denies Sexual Abuse: Denies Exploitation of patient/patient's resources:  Denies Self-Neglect: Denies     Merchant navy officerAdvance Directives (For Healthcare) Does Patient Have a Medical Advance Directive?: No Would patient like information on creating a medical advance directive?: No - Patient declined          Disposition:  Disposition Initial Assessment Completed for this Encounter: Yes Patient referred to: Other (Comment)(Pt to be seen by psychiatry to complete 1st opinion)  On Site Evaluation by:   Reviewed with Physician:    Alexandria LodgeHarvey, Marli Diego Ray 08/06/2017 9:07 PM

## 2017-08-06 NOTE — ED Notes (Signed)
Pt has other handcuff off, he continues to sleep, sitter at bedside.

## 2017-08-06 NOTE — ED Notes (Signed)
Bed: [facility]35 Expected date:  Expected time:  Means of arrival:  Comments: 

## 2017-08-07 ENCOUNTER — Inpatient Hospital Stay (HOSPITAL_COMMUNITY): Admit: 2017-08-07 | Payer: Self-pay | Admitting: Psychiatry

## 2017-08-07 DIAGNOSIS — F3130 Bipolar disorder, current episode depressed, mild or moderate severity, unspecified: Secondary | ICD-10-CM | POA: Diagnosis not present

## 2017-08-07 DIAGNOSIS — R45851 Suicidal ideations: Secondary | ICD-10-CM

## 2017-08-07 DIAGNOSIS — R451 Restlessness and agitation: Secondary | ICD-10-CM | POA: Insufficient documentation

## 2017-08-07 LAB — RAPID URINE DRUG SCREEN, HOSP PERFORMED
Amphetamines: NOT DETECTED
Benzodiazepines: POSITIVE — AB
Cocaine: NOT DETECTED
OPIATES: NOT DETECTED
Tetrahydrocannabinol: POSITIVE — AB

## 2017-08-07 NOTE — Consult Note (Addendum)
[facility]  08/07/2017 1:29 PM Caleb Bartlett  MRN:  [id number] Principal Problem: Bipolar disorder, unspecified [facility](HCC) Discharge Diagnoses:  Patient Active Problem List   Diagnosis Date Noted  . Agitation [R45.1]   . Suicidal ideation [R45.851]   . Severe recurrent major depression without psychotic features (HCC) [F33.2] 06/15/2017  . Routine screening for STI (sexually transmitted infection) [Z11.3] 02/16/2016  . Dysphagia, pharyngoesophageal phase [R13.14] 02/08/2010  . BIPOLAR DISORDER UNSPECIFIED [F31.9] 09/17/2008  . OPPOSITIONAL DEFIANT DISORDER [F91.3] 04/01/2008  . 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 Attention deficit hyperactivity disorder (ADHD) [F90.9] 03/30/2006  . RHINITIS, ALLERGIC [J30.9] 03/30/2006  . ASTHMA, PERSISTENT [J45.909] 03/30/2006    Subjective: Pt was seen and chart reviewed with treatment team and Dr [name]. Pt denies suicidal/homicidal ideation, denies auditory/visual hallucinations and does not appear to be responding to internal stimuli. [facility] suicidal ideation and a plan to overdose on all of his mood stabilizers and to cut himself with a knife. [facility] arrived in time to prevent this and brought him to the hospital. Pt stated he just felt hopeless but will not elaborate as to why. Today, he feels better and wants to go home and go to work today. He reports several protective factors against suicide including his family and his two jobs. Labs were reviewed and were unremarkable. No other tests were ordered.  Collateral: Pt's mother stated she is comfortable with taking pt home with her today.  Pt is stable and psychiatrically clear for discharge.   Total Time spent with patient: 45 minutes  Past Psychiatric History: As above  Past Medical History:  Past Medical History:  Diagnosis Date  . ADHD   . Asthma   . Bipolar 1 disorder (HCC)   . GERD (gastroesophageal reflux disease)    . Oppositional defiant behavior   . STI (sexually transmitted infection)    Past Surgical History:  Procedure Laterality Date  . KIDNEY SURGERY     Family History: No family history on file. Family Psychiatric  History: Unknown Social History:  Social History   Substance and Sexual Activity  Alcohol Use No    Social History   Substance and Sexual Activity  Drug Use No   Social History   Socioeconomic History  . Marital status: Single    Spouse name: Not on file  . Number of children: Not on file  . Years of education: Not on file  . Highest education level: Not on file  Occupational History  . Not on file  Social Needs  . Financial resource strain: Not on file  . Food insecurity:    Worry: Not on file    Inability: Not on file  . Transportation needs:    Medical: Not on file    Non-medical: Not on file  Tobacco Use  . Smoking status: Never Smoker  . Smokeless tobacco: Never Used  Substance and Sexual Activity  . Alcohol use: No  . Drug use: No  . Sexual activity: Yes  Lifestyle  . Physical activity:    Days per week: Not on file    Minutes per session: Not on file  . Stress: Not on file  Relationships  . Social connections:    Talks on phone: Not on file    Gets together: Not on file    Attends religious service: Not on file    Active member of club or organization: Not on file  Attends meetings of clubs or organizations: Not on file    Relationship status: Not on file  Other Topics Concern  . Not on file  Social History Narrative  . Not on file    Has this patient used any form of tobacco in the last 30 days? (Cigarettes, Smokeless Tobacco, Cigars, and/or Pipes) Prescription not provided because: pt declined  Current Medications: Current Facility-Administered Medications  Medication Dose Route Frequency Provider Last Rate Last Dose  . acetaminophen (TYLENOL) tablet 650 mg  650 mg Oral Q4H PRN Dartha Lodge, PA-C      . albuterol (PROVENTIL  HFA;VENTOLIN HFA) 108 (90 Base) MCG/ACT inhaler 2 puff  2 puff Inhalation Q4H PRN Azalia Bilis, MD      . hydrOXYzine (ATARAX/VISTARIL) tablet 25 mg  25 mg Oral TID PRN Dartha Lodge, PA-C       Current Outpatient Medications  Medication Sig Dispense Refill  . albuterol (PROVENTIL) 90 MCG/ACT inhaler Inhale 2 puffs into the lungs every 4 (four) hours as needed for shortness of breath.     . citalopram (CELEXA) 10 MG tablet Take 1 tablet (10 mg total) by mouth daily. 30 tablet 0  . eszopiclone (LUNESTA) 2 MG TABS tablet Take 2 mg by mouth at bedtime as needed for sleep. Take immediately before bedtime    . hydrOXYzine (ATARAX/VISTARIL) 25 MG tablet Take 1 tablet (25 mg total) by mouth 3 (three) times daily as needed for anxiety. 30 tablet 0  . lamoTRIgine (LAMICTAL) 25 MG tablet Take 25 mg by mouth See admin instructions. TAKE 2 TABLETS BY MOUTH ONCE DAILY FOR 5 DAYS THEN 3 TABS ONCE DAILY FOR 5 DAYS THEN 4 TABS ONCE DAILY STARTING 07/03  0  . traZODone (DESYREL) 50 MG tablet Take 1 tablet (50 mg total) by mouth at bedtime as needed for sleep. 30 tablet 0   PTA Medications:  (Not in a hospital admission)  Musculoskeletal: Strength & Muscle Tone: within normal limits Gait & Station: normal Patient leans: N/A  Psychiatric Specialty Exam: Physical Exam  Nursing note and vitals reviewed. Constitutional: He is oriented to person, place, and time. He appears well-developed and well-nourished.  HENT:  Bartlett: Normocephalic and atraumatic.  Neck: Normal range of motion.  Respiratory: Effort normal.  Musculoskeletal: Normal range of motion.  Neurological: He is alert and oriented to person, place, and time.  Psychiatric: His speech is normal and behavior is normal. Thought content normal. His mood appears anxious. Cognition and memory are normal. He expresses impulsivity. He exhibits a depressed mood.    Review of Systems  Psychiatric/Behavioral: Positive for depression and substance abuse.  Negative for hallucinations, memory loss and suicidal ideas. The patient is nervous/anxious. The patient does not have insomnia.   All other systems reviewed and are negative.   Blood pressure (!) 148/106, pulse 77, temperature 98.5 F (36.9 C), resp. rate 20, SpO2 98 %.There is no height or weight on file to calculate BMI.  General Appearance: Casual  Eye Contact:  Good  Speech:  Clear and Coherent  Volume:  Normal  Mood:  Depressed  Affect:  Congruent and Depressed  Thought Process:  Coherent, Linear and Descriptions of Associations: Intact  Orientation:  Full (Time, Place, and Person)  Thought Content:  Logical  Suicidal Thoughts:  No  Homicidal Thoughts:  No  Memory:  Immediate;   Good Recent;   Good Remote;   Fair  Judgement:  Fair  Insight:  Fair  Psychomotor Activity:  Normal  Concentration:  Concentration: Good and Attention Span: Good  Recall:  Good  Fund of Knowledge:  Good  Language:  Good  Akathisia:  No  Handed:  Right  AIMS (if indicated):   N/A  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  Patients presenting with no risk factors but with morbid ruminations; may be classified as minimal risk based on the severity of the depressive symptoms    Plan Of Care/Follow-up recommendations:  Activity:  as tolerated Diet:  Heart  healthy  Disposition: Discharge Home Follow up with Dr Sherrine Maples for medication management and therapy Continue taking your home medications as prescribed -Celexa 10 mg daily for depression -Lamictal 50 mg daily for mood stabilization Avoid the use of alcohol or illicit drugs  Laveda Abbe, NP 08/07/2017, 1:29 PM  Patient seen face-to-face for psychiatric evaluation, chart reviewed and case discussed with the physician extender and developed treatment plan. Reviewed the information documented and agree with the treatment plan.  [name], DO 08/07/17 8:34 PM

## 2017-08-07 NOTE — Discharge Instructions (Signed)
For your behavioral health needs, you are advised to follow up with the providers listed below at your earliest opportunity:       [facility]      [address]      [place], [place] [address]      [phone]       [facility]      [address]      [place], [place] [address]      [phone]

## 2017-08-07 NOTE — BH Assessment (Signed)
[facility]  Per Juanetta BeetsJacqueline Norman, DO, this pt does not require psychiatric hospitalization at this time.  Pt presents under IVC initiated by pt's mother, which Dr [name] has rescinded.  [facility], and upon discharge was advised to follow up witt [facility] and [facility].  Today's discharge instructions advise pt to follow up with the same providers.  Pt's nurse, [name], has been notified.  Caleb Canninghomas Connie Lasater, MA Triage Specialist [phone]

## 2017-08-07 NOTE — ED Notes (Signed)
Pt is calm and cooperative today.  He denies S/I at this time.   He denies H/I and AVH.  Pt states that inpatient has helped in the past with "these episodes"  Mother is at bedside.

## 2019-02-05 ENCOUNTER — Ambulatory Visit: Payer: Managed Care, Other (non HMO) | Attending: Internal Medicine

## 2019-02-05 ENCOUNTER — Ambulatory Visit: Payer: Medicaid Other | Attending: Internal Medicine

## 2019-02-05 DIAGNOSIS — Z20822 Contact with and (suspected) exposure to covid-19: Secondary | ICD-10-CM

## 2019-02-07 LAB — NOVEL CORONAVIRUS, NAA: SARS-CoV-2, NAA: DETECTED — AB

## 2023-05-06 ENCOUNTER — Ambulatory Visit (HOSPITAL_COMMUNITY): Admission: EM | Admit: 2023-05-06 | Discharge: 2023-05-06 | Disposition: A | Discharge status: Home / Self Care

## 2023-05-06 ENCOUNTER — Encounter (HOSPITAL_COMMUNITY): Payer: Self-pay

## 2023-05-06 DIAGNOSIS — K529 Noninfective gastroenteritis and colitis, unspecified: Secondary | ICD-10-CM | POA: Diagnosis not present

## 2023-05-06 DIAGNOSIS — R112 Nausea with vomiting, unspecified: Secondary | ICD-10-CM

## 2023-05-06 NOTE — Discharge Instructions (Addendum)
 Today we discussed your concerns for ongoing diarrhea Diarrhea is usually self-limited and can have many causes The central tenants of management include the following: Staying well hydrated - this includes increasing water intake and supplementing with Pedialyte, Gatorade, etc. Try to eat a bland diet- this includes boiled chicken, brown rice, bananas, applesauce, plain toast, etc. As you are feeling better you can usually begin to slowly reincorporate your normal dietary choices back into your dietary consumption Using Imodium and Pepto Bismol as needed and according to manufacturers instructions to provide assistance with managing symptoms.   Diarrhea with the following will need to be monitored and may require further testing: bloody diarrhea, fever, weight loss, signs of dehydration, weakness, fatigue, incontinence, diarrhea lasting longer than 1-3 weeks

## 2023-05-06 NOTE — ED Provider Notes (Signed)
 [facility]    CSN: [id number] Arrival date & time: 05/06/23  1207      History   Chief Complaint Chief Complaint  Patient presents with   Diarrhea    HPI Caleb Bartlett is a 28 y.o. male.   HPI  He reports he has been having nausea, vomiting, and liquid stools He states these symptoms started early this AM   He states that he ate chinese food last night but is not sure if this is the cause He reports he little sister is also having similar symptoms and did not eat the same things as him  Interventions: he has eaten some crackers and drank ginger ale to see if this would help with symptoms  He reports nausea has largely resolved  He feels like he can tolerate PO intake with out issues    Past Medical History:  Diagnosis Date   ADHD    Asthma    Bipolar 1 disorder (HCC)    GERD (gastroesophageal reflux disease)    Oppositional defiant behavior    STI (sexually transmitted infection)     Patient Active Problem List   Diagnosis Date Noted   Agitation    Suicidal ideation    Severe recurrent major depression without psychotic features (HCC) 06/15/2017   Routine screening for STI (sexually transmitted infection) 02/16/2016   Dysphagia, pharyngoesophageal phase 02/08/2010   BIPOLAR DISORDER UNSPECIFIED 09/17/2008   Oppositional defiant disorder 04/01/2008   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 No pertinent family history.  Social History Social History   Tobacco Use   Smoking status: Never   Smokeless tobacco: Never  Substance Use Topics   Alcohol use: No   Drug use: Yes    Frequency: 4.0 times per week    Types: Marijuana      Allergies   Patient has no known allergies.   Review of Systems Review of Systems  Constitutional:  Negative for chills and fever.  Respiratory:  Negative for cough, choking, shortness of breath and wheezing.   Gastrointestinal:  Positive for diarrhea, nausea and vomiting. Negative for abdominal pain and blood in stool.  Musculoskeletal:  Negative for myalgias.  Neurological:  Positive for headaches (this AM but resolved at time of visit). Negative for dizziness and light-headedness.     Physical Exam Triage Vital Signs ED Triage Vitals  Encounter Vitals Group     BP 05/06/23 1406 119/74     Systolic BP Percentile --      Diastolic BP Percentile --      Pulse Rate 05/06/23 1406 72     Resp 05/06/23 1406 18     Temp 05/06/23 1406 97.8 F (36.6 C)     Temp Source 05/06/23 1406 Oral     SpO2 05/06/23 1406 98 %     Weight --      Height --      Head Circumference --      Peak Flow --      Pain Score 05/06/23 1407 3     Pain Loc --      Pain Education --      Exclude  from Growth Chart --    No data found.  Updated Vital Signs BP 119/74 (BP Location: Right Arm)   Pulse 72   Temp 97.8 F (36.6 C) (Oral)   Resp 18   SpO2 98%   Visual Acuity Right Eye Distance:   Left Eye Distance:   Bilateral Distance:    Right Eye Near:   Left Eye Near:    Bilateral Near:     Physical Exam Vitals reviewed.  Constitutional:      General: He is awake.     Appearance: Normal appearance. He is well-developed and well-groomed.  HENT:     Head: Normocephalic and atraumatic.  Eyes:     Extraocular Movements: Extraocular movements intact.     Conjunctiva/sclera: Conjunctivae normal.  Pulmonary:     Effort: Pulmonary effort is normal.  Musculoskeletal:     Cervical back: Normal range of motion.  Neurological:     General: No focal deficit present.     Mental Status: He is alert and oriented to person, place, and time.     GCS: GCS eye subscore is 4. GCS verbal subscore  is 5. GCS motor subscore is 6.  Psychiatric:        Attention and Perception: Attention normal.        Mood and Affect: Mood normal.        Speech: Speech normal.        Behavior: Behavior normal. Behavior is cooperative.      UC Treatments / Results  Labs (all labs ordered are listed, but only abnormal results are displayed) Labs Reviewed - No data to display  EKG   Radiology No results found.  Procedures Procedures (including critical care time)  Medications Ordered in UC Medications - No data to display  Initial Impression / Assessment and Plan / UC Course  I have reviewed the triage vital signs and the nursing notes.  Pertinent labs & imaging results that were available during my care of the patient were reviewed by me and considered in my medical decision making (see chart for details).      Final Clinical Impressions(s) / UC Diagnoses   Final diagnoses:  Nausea vomiting and diarrhea  Gastroenteritis   Patient presents today with concerns for nausea, vomiting, liquid stool since last night.  He reports that his younger sibling had similar symptoms and he is not noticed any other recent sick contacts other than this.  He has tried to eat crackers and drink ginger ale to see if this would help.  Physical exam vitals are overall reassuring.  At this time I suspect potential viral gastroenteritis.  Recommend conservative measures comprised of increased hydration efforts and bland diet as tolerated.  Reviewed that he can use half doses of Imodium or Pepto-Bismol per preference to assist with symptoms if needed.  ED and return precautions were reviewed and provided in after visit summary.  Follow-up as needed    Discharge Instructions      Today we discussed your concerns for ongoing diarrhea Diarrhea is usually self-limited and can have many causes The central tenants of management include the following: Staying well hydrated - this includes increasing water intake  and supplementing with Pedialyte, Gatorade, etc. Try to eat a bland diet- this includes boiled chicken, brown rice, bananas, applesauce, plain toast, etc. As you are feeling better you can usually begin to slowly reincorporate your normal dietary choices back into your dietary consumption Using Imodium and Pepto Bismol as needed and according  to manufacturers instructions to provide assistance with managing symptoms.   Diarrhea with the following will need to be monitored and may require further testing: bloody diarrhea, fever, weight loss, signs of dehydration, weakness, fatigue, incontinence, diarrhea lasting longer than 1-3 weeks          ED Prescriptions   None    PDMP not reviewed this encounter.   Ethelreda Sukhu, Oswaldo Conroy, PA-C 05/07/23 1359

## 2023-05-06 NOTE — ED Triage Notes (Signed)
 Patient states he has liquid stool today after eating chinese food last night. Patient states he wasn't sure if it's a bug or the [place] food. States he is unable to return to work without a note.

## 2023-05-31 ENCOUNTER — Ambulatory Visit (HOSPITAL_COMMUNITY): Admission: EM | Admit: 2023-05-31 | Discharge: 2023-05-31 | Disposition: A | Discharge status: Home / Self Care

## 2023-05-31 ENCOUNTER — Encounter (HOSPITAL_COMMUNITY): Payer: Self-pay

## 2023-05-31 DIAGNOSIS — R197 Diarrhea, unspecified: Secondary | ICD-10-CM | POA: Diagnosis not present

## 2023-05-31 DIAGNOSIS — R112 Nausea with vomiting, unspecified: Secondary | ICD-10-CM | POA: Diagnosis not present

## 2023-05-31 NOTE — ED Triage Notes (Signed)
 Pt c/o center abdominal pain x2 days after eating a goat cheese on a burger. States small diarrhea this am. States drinking ginger ale and eating saltine crackers.

## 2023-05-31 NOTE — Discharge Instructions (Addendum)
 Please continue to follow a bland diet.  Since you are tolerating liquids I suggest advancing to solids.  Bananas, rice, toast and applesauce are good starting points.  Avoid fried or spicy foods, as these can upset the stomach lining.  Ensure you are drinking at least 64 ounces of water daily.  For any pain you can alternate between 600 mg of ibuprofen and 500 mg of Tylenol  every 4-6 hours.  Return to clinic for any new or urgent symptoms.

## 2023-05-31 NOTE — ED Provider Notes (Signed)
 [facility]    CSN: [id number] Arrival date & time: 05/31/23  4098      History   Chief Complaint Chief Complaint  Patient presents with   Abdominal Pain    HPI Caleb Bartlett is a 28 y.o. male.   Patient presents to clinic over concerns of generalized abdominal pain, nausea, vomiting and diarrhea.  Symptoms started 2 days ago after eating a spicy goat cheeseburger from [employer].  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  He did take a few days off due to not feeling well and when he came back he ate this burger and he had 1 episode of vomiting and a few episodes of diarrhea.  He has had diarrhea this morning.  Reports the abdominal pain is improved.  Has been eating saltine crackers and drinking ginger ale.  Has not any fevers.  Denies recent sick contacts.  The history is provided by the patient and medical records.  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 No pertinent family history.  Social  History Social History   Tobacco Use   Smoking status: Never   Smokeless tobacco: Never  Substance Use Topics   Alcohol use: No   Drug use: Yes    Frequency: 4.0 times per week    Types: Marijuana     Allergies   Patient has no known allergies.   Review of Systems Review of Systems  Per HPI  Physical Exam Triage Vital Signs ED Triage Vitals  Encounter Vitals Group     BP 05/31/23 0824 130/85     Systolic BP Percentile --      Diastolic BP Percentile --      Pulse Rate 05/31/23 0824 75     Resp 05/31/23 0824 18     Temp 05/31/23 0824 97.6 F (36.4 C)     Temp Source 05/31/23 0824 Oral     SpO2 05/31/23 0824 96 %     Weight --      Height --      Head Circumference --      Peak Flow --      Pain Score 05/31/23 0825 4     Pain Loc --      Pain Education --      Exclude from Growth Chart --    No data found.  Updated Vital Signs BP 130/85 (BP Location: Left Arm)   Pulse 75   Temp 97.6 F (36.4 C) (  Oral)   Resp 18   SpO2 96%   Visual Acuity Right Eye Distance:   Left Eye Distance:   Bilateral Distance:    Right Eye Near:   Left Eye Near:    Bilateral Near:     Physical Exam Vitals and nursing note reviewed.  Constitutional:      Appearance: He is well-developed.  HENT:     Head: Normocephalic and atraumatic.  Cardiovascular:     Rate and Rhythm: Normal rate.  Pulmonary:     Effort: Pulmonary effort is normal. No respiratory distress.  Abdominal:     General: Abdomen is flat. Bowel sounds are normal.     Palpations: Abdomen is soft.     Tenderness: There is no abdominal tenderness. There is no guarding or rebound.  Skin:    General: Skin is warm and dry.  Neurological:     General: No focal deficit present.     Mental Status: He is alert.  Psychiatric:        Mood and Affect: Mood normal.      UC Treatments / Results  Labs (all labs ordered are listed, but only abnormal results are displayed) Labs Reviewed - No data to  display  EKG   Radiology No results found.  Procedures Procedures (including critical care time)  Medications Ordered in UC Medications - No data to display  Initial Impression / Assessment and Plan / UC Course  I have reviewed the triage vital signs and the nursing notes.  Pertinent labs & imaging results that were available during my care of the patient were reviewed by me and considered in my medical decision making (see chart for details).  Vitals in triage reviewed, patient is hemodynamically stable.  Abdomen is soft and nontender with active bowel sounds.  Suspect food poisoning versus viral gastroenteritis, tolerating p.o. fluids and overall improving.  Bland diet suggested.  Plan of care, follow-up care return precautions given, no questions at this time.  Work note provided.     Final Clinical Impressions(s) / UC Diagnoses   Final diagnoses:  Nausea vomiting and diarrhea     Discharge Instructions      Please continue to follow a bland diet.  Since you are tolerating liquids I suggest advancing to solids.  Bananas, rice, toast and applesauce are good starting points.  Avoid fried or spicy foods, as these can upset the stomach lining.  Ensure you are drinking at least 64 ounces of water daily.  For any pain you can alternate between 600 mg of ibuprofen and 500 mg of Tylenol  every 4-6 hours.  Return to clinic for any new or urgent symptoms.    ED Prescriptions   None    PDMP not reviewed this encounter.   Harlow Lighter, Bunny Lowdermilk  N, [place] 05/31/23 [phone]

## 2023-06-22 ENCOUNTER — Ambulatory Visit (HOSPITAL_COMMUNITY)
Admission: EM | Admit: 2023-06-22 | Discharge: 2023-06-22 | Disposition: A | Discharge status: Home / Self Care | Attending: Family Medicine | Admitting: Family Medicine

## 2023-06-22 ENCOUNTER — Encounter (HOSPITAL_COMMUNITY): Payer: Self-pay

## 2023-06-22 DIAGNOSIS — A084 Viral intestinal infection, unspecified: Secondary | ICD-10-CM

## 2023-06-22 MED ORDER — HYOSCYAMINE SULFATE 0.125 MG PO TBDP: 0.1250 mg | ORAL_TABLET | Freq: Four times a day (QID) | ORAL | 0 refills | Status: AC | PRN

## 2023-06-22 NOTE — ED Triage Notes (Signed)
 Chief Complaint: nausea and abdominal pain. Denies emesis or diarrhea.   Sick exposure: Yes- His daughter was sick first   Onset: 3 days ago  Prescriptions or OTC medications tried: Yes- otc fever and pain meds   with little relief  New foods, medications, or products: Sushi for the first time recently.   Recent Travel: No

## 2023-06-22 NOTE — Discharge Instructions (Signed)
 You can take hyoscyamine for abdominal cramps avoid taking this for prolonged period of time Continue to maintain good oral hydration. Reintroducing bland foods into your diet as tolerated. Monitor your stool for any blood or mucus.  If you do have this then return for evaluation. Diligent handwashing and cleaning of frequently touched surfaces can help decrease the spread of pathogens

## 2023-06-22 NOTE — ED Provider Notes (Signed)
 [facility]    CSN: [id number] Arrival date & time: 06/22/23  4098      History   Chief Complaint Chief Complaint  Patient presents with   Abdominal Pain    HPI Caleb Bartlett is a 28 y.o. male.   The patient reports 3 days of lower ABD pain, nausea, and diarrhea but no vomiting. His daughter developed nausea, vomiting and diarrhea that resolved 1-2 days before his sxs started. He denies any pain or nausea associated with eating and no blood or mucus in his stool. No specific RUQ pain and the pain is waxing and waning. He has been drinking plenty of water and electrolytes but only able to eat bland crackers and bread without nausea.   The history is provided by the patient.  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 Authorizing Provider  hyoscyamine  (ANASPAZ) 0.125 MG TBDP disintergrating tablet Place 1 tablet (0.125 mg total) under the tongue every 6 (six) hours as needed for up to 7 days for cramping. 06/22/23 06/29/23 Yes Claybon Cuna, MD    Family History History reviewed. No pertinent family history.  Social History Social History   Tobacco Use   Smoking status: Never   Smokeless tobacco: Never  Substance Use Topics   Alcohol use: No   Drug use: Yes    Frequency: 4.0 times per week    Types: Marijuana     Allergies   Chocolate flavoring agent (non-screening)   Review of Systems Review of Systems  Constitutional:  Positive for appetite change. Negative for chills, fatigue and fever.  HENT:  Negative for congestion, mouth sores and sore throat.   Respiratory:  Negative for cough and shortness of breath.   Cardiovascular:  Negative for chest pain and palpitations.  Gastrointestinal:  Positive for abdominal pain, diarrhea and nausea. Negative for abdominal distention, anal bleeding, blood in stool, constipation, rectal pain and vomiting.  Genitourinary:  Negative for difficulty urinating, dysuria, flank pain and hematuria.  Musculoskeletal:  Negative for arthralgias, joint swelling and myalgias.  Skin:  Negative for rash.  Neurological:  Negative for headaches.     Physical Exam Triage Vital Signs ED Triage Vitals  Encounter Vitals Group     BP 06/22/23 0902 133/87     Systolic BP Percentile --  Diastolic BP Percentile --      Pulse Rate 06/22/23 0902 74     Resp 06/22/23 0902 16     Temp 06/22/23 0902 (!) 97.5 F (36.4 C)     Temp Source 06/22/23 0902 Oral     SpO2 06/22/23 0902 96 %     Weight --      Height --      Head Circumference --      Peak Flow --      Pain Score 06/22/23 0901 7     Pain Loc --      Pain Education --      Exclude from Growth Chart --    No data found.  Updated Vital Signs BP 133/87 (BP Location: Left Wrist)   Pulse 74   Temp (!) 97.5 F (36.4 C) (Oral)    Resp 16   SpO2 96%   Visual Acuity Right Eye Distance:   Left Eye Distance:   Bilateral Distance:    Right Eye Near:   Left Eye Near:    Bilateral Near:     Physical Exam Vitals reviewed.  Constitutional:      General: He is not in acute distress.    Appearance: He is well-developed. He is not ill-appearing, toxic-appearing or diaphoretic.  Eyes:     Extraocular Movements: Extraocular movements intact.     Pupils: Pupils are equal, round, and reactive to light.  Cardiovascular:     Rate and Rhythm: Normal rate and regular rhythm.     Heart sounds: Normal heart sounds. No murmur heard.    No gallop.  Pulmonary:     Effort: Pulmonary effort is normal.     Breath sounds: Normal breath sounds.  Abdominal:     General: Abdomen is flat. A surgical scar is present. Bowel sounds are increased. There is no distension or abdominal bruit. There are no signs of injury.     Palpations: Abdomen is soft. There is no shifting dullness, fluid wave, hepatomegaly, splenomegaly or mass.     Tenderness: There is abdominal tenderness in the right lower quadrant and left lower quadrant. There is no right CVA tenderness, left CVA tenderness, guarding or rebound. Negative signs include Murphy's sign and McBurney's sign.     Hernia: No hernia is present.  Skin:    General: Skin is warm.     Capillary Refill: Capillary refill takes 2 to 3 seconds.     Coloration: Skin is not jaundiced or pale.     Findings: No erythema or rash.  Neurological:     Mental Status: He is alert.  Psychiatric:        Mood and Affect: Mood normal.      UC Treatments / Results  Labs (all labs ordered are listed, but only abnormal results are displayed) Labs Reviewed - No data to display  EKG   Radiology No results found.  Procedures Procedures (including critical care time)  Medications Ordered in UC Medications - No data to display  Initial Impression / Assessment and Plan / UC Course  I have reviewed  the triage vital signs and the nursing notes.  Pertinent labs & imaging results that were available during my care of the patient were reviewed by me and considered in my medical decision making (see chart for details).     Viral gastroenteritis -The patient has sick exposure from his daughter with similar sxs.  He has not had any red flag symptoms - We discussed symptomatic  treatment with sparing use of hyoscyamine for abdominal cramps. - He will continue to maintain good oral hydration and electrolyte replacement - We discussed monitoring and return criteria - The patient voiced understanding and agreement with the plan.    Final Clinical Impressions(s) / UC Diagnoses   Final diagnoses:  Viral gastroenteritis     Discharge Instructions      You can take hyoscyamine for abdominal cramps avoid taking this for prolonged period of time Continue to maintain good oral hydration. Reintroducing bland foods into your diet as tolerated. Monitor your stool for any blood or mucus.  If you do have this then return for evaluation. Diligent handwashing and cleaning of frequently touched surfaces can help decrease the spread of pathogens   ED Prescriptions     Medication Sig Dispense Auth. Provider   hyoscyamine (ANASPAZ) 0.125 MG TBDP disintergrating tablet Place 1 tablet (0.125 mg total) under the tongue every 6 (six) hours as needed for up to 7 days for cramping. 28 tablet Claybon Cuna, MD      PDMP not reviewed this encounter.   Claybon Cuna, MD 06/22/23 [phone]

## 2023-11-12 ENCOUNTER — Ambulatory Visit (HOSPITAL_COMMUNITY)
Admission: EM | Admit: 2023-11-12 | Discharge: 2023-11-12 | Disposition: A | Discharge status: Home / Self Care | Attending: Emergency Medicine | Admitting: Emergency Medicine

## 2023-11-12 ENCOUNTER — Encounter (HOSPITAL_COMMUNITY): Payer: Self-pay | Admitting: *Deleted

## 2023-11-12 ENCOUNTER — Other Ambulatory Visit: Payer: Self-pay

## 2023-11-12 DIAGNOSIS — R197 Diarrhea, unspecified: Secondary | ICD-10-CM

## 2023-11-12 DIAGNOSIS — R519 Headache, unspecified: Secondary | ICD-10-CM | POA: Diagnosis not present

## 2023-11-12 DIAGNOSIS — R1084 Generalized abdominal pain: Secondary | ICD-10-CM | POA: Diagnosis not present

## 2023-11-12 MED ORDER — KETOROLAC TROMETHAMINE 30 MG/ML IJ SOLN
INTRAMUSCULAR | Status: AC
  Filled 2023-11-12: qty 1

## 2023-11-12 MED ORDER — METOCLOPRAMIDE HCL 5 MG/ML IJ SOLN
5.0000 mg | Freq: Once | INTRAMUSCULAR | Status: AC
  Administered 2023-11-12: 5 mg via INTRAMUSCULAR

## 2023-11-12 MED ORDER — DEXAMETHASONE SOD PHOSPHATE PF 10 MG/ML IJ SOLN
10.0000 mg | Freq: Once | INTRAMUSCULAR | Status: AC
  Administered 2023-11-12: 10 mg via INTRAMUSCULAR

## 2023-11-12 MED ORDER — KETOROLAC TROMETHAMINE 30 MG/ML IJ SOLN
30.0000 mg | Freq: Once | INTRAMUSCULAR | Status: AC
  Administered 2023-11-12: 30 mg via INTRAMUSCULAR

## 2023-11-12 MED ORDER — METOCLOPRAMIDE HCL 5 MG/ML IJ SOLN
INTRAMUSCULAR | Status: AC
  Filled 2023-11-12: qty 2

## 2023-11-12 NOTE — ED Provider Notes (Signed)
 [facility]    CSN: [id number] Arrival date & time: 11/12/23  1034      History   Chief Complaint Chief Complaint  Patient presents with   Headache   Abdominal Pain    HPI Caleb Bartlett is a 28 y.o. male.   Patient presents to clinic over concern of left-sided headache and generalized abdominal pain with diarrhea for the past 2 days.  Daughter is getting over hand-foot-and-mouth, patient has not noticed any rashes or lesions.  Has not had fevers, did wake up in sweats 1 morning.  Has been tolerating oral foods.  Without nausea or vomiting.  Has been taking Tylenol  for headache which works temporarily, noticed that it was still be present in the morning.  Endorses light sensitivity, without noise sensitivity.  Reports history of previous headaches like this in the past.  The history is provided by the patient and medical records.  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 Authorizing Provider  hyoscyamine  (ANASPAZ ) 0.125 MG TBDP disintergrating tablet  Place 1 tablet (0.125 mg total) under the tongue every 6 (six) hours as needed for up to 7 days for cramping. 06/22/23 06/29/23  Janet Lonni BRAVO, MD    Family History History reviewed. No pertinent family history.  Social History Social History   Tobacco Use   Smoking status: Never   Smokeless tobacco: Never  Substance Use Topics   Alcohol use: No   Drug use: Yes    Frequency: 4.0 times per week    Types: Marijuana     Allergies   Chocolate flavoring agent (non-screening)   Review of Systems Review of Systems  Per HPI  Physical Exam Triage Vital Signs ED Triage Vitals  Encounter Vitals Group     BP 11/12/23 1158 119/84     Girls Systolic BP Percentile --      Girls Diastolic BP Percentile --      Boys Systolic BP Percentile --      Boys Diastolic BP Percentile --      Pulse Rate 11/12/23 1158 62     Resp 11/12/23 1158 20     Temp 11/12/23 1158 98.2 F (36.8 C)     Temp src --      SpO2 11/12/23 1158 98 %     Weight --      Height --      Head Circumference --      Peak Flow --      Pain Score 11/12/23 1157 7     Pain Loc --  Pain Education --      Exclude from Growth Chart --    No data found.  Updated Vital Signs BP 119/84   Pulse 62   Temp 98.2 F (36.8 C)   Resp 20   SpO2 98%   Visual Acuity Right Eye Distance:   Left Eye Distance:   Bilateral Distance:    Right Eye Near:   Left Eye Near:    Bilateral Near:     Physical Exam Vitals and nursing note reviewed.  Constitutional:      Appearance: He is well-developed.  HENT:     Head: Normocephalic and atraumatic.     Mouth/Throat:     Mouth: Mucous membranes are moist.  Eyes:     General: No scleral icterus.    Extraocular Movements: Extraocular movements intact.     Pupils: Pupils are equal, round, and reactive to light.  Cardiovascular:     Rate and Rhythm: Normal rate.  Pulmonary:     Effort: Pulmonary effort is normal. No respiratory distress.  Abdominal:     General:  Abdomen is flat. Bowel sounds are normal.     Palpations: Abdomen is soft.     Tenderness: There is generalized abdominal tenderness. There is no guarding or rebound.  Skin:    General: Skin is warm and dry.  Neurological:     General: No focal deficit present.     Mental Status: He is alert and oriented to person, place, and time.     GCS: GCS eye subscore is 4. GCS verbal subscore is 5. GCS motor subscore is 6.     Cranial Nerves: Cranial nerves 2-12 are intact.     Motor: Motor function is intact. No weakness.  Psychiatric:        Mood and Affect: Mood normal.        Behavior: Behavior normal.      UC Treatments / Results  Labs (all labs ordered are listed, but only abnormal results are displayed) Labs Reviewed - No data to display  EKG   Radiology No results found.  Procedures Procedures (including critical care time)  Medications Ordered in UC Medications  ketorolac (TORADOL) 30 MG/ML injection 30 mg (has no administration in time range)  metoCLOPramide (REGLAN) injection 5 mg (has no administration in time range)  dexamethasone (DECADRON) injection 10 mg (has no administration in time range)    Initial Impression / Assessment and Plan / UC Course  I have reviewed the triage vital signs and the nursing notes.  Pertinent labs & imaging results that were available during my care of the patient were reviewed by me and considered in my medical decision making (see chart for details).  Vitals and triage reviewed, patient is hemodynamically stable.  Abdomen is soft with active bowel sounds, does have generalized tenderness, without rebound or guarding.  Low concern for acute abdomen at this time.  Suspect viral gastroenteritis with abdominal cramping and diarrhea.  Bland diet encouraged.  GCS 15.  PERRLA. EOMI. strength 5 out of 5 in bilateral upper extremities.  Headache consistent with previous, low concern for acute neurological etiology.  Will treat symptomatically  with migraine cocktail.  Side effects discussed.  Plan of care, follow-up care return precautions given, no questions at this time.  Work note provided.     Final Clinical Impressions(s) / UC Diagnoses   Final diagnoses:  Bad headache  Generalized abdominal pain  Diarrhea, unspecified type     Discharge Instructions  We have given you medications to help with your headache.  Please go home and get plenty of rest.  Ensure you are drinking at least 64 ounces of water daily and staying off of your phone, low stimulation environment.  For your abdominal pain and cramping please follow a bland diet such as bananas, rice, toast and applesauce.  Can gradually progress the diet as tolerated.  Avoid fried, spicy or processed foods as these can irritate the GI system.  Follow-up with your primary care provider for any continued symptoms.  Return to clinic for any new or concerning symptoms.      ED Prescriptions   None    PDMP not reviewed this encounter.   Dreama, Dynisha Due  N, FNP 11/12/23 1222

## 2023-11-12 NOTE — Discharge Instructions (Signed)
 We have given you medications to help with your headache.  Please go home and get plenty of rest.  Ensure you are drinking at least 64 ounces of water daily and staying off of your phone, low stimulation environment.  For your abdominal pain and cramping please follow a bland diet such as bananas, rice, toast and applesauce.  Can gradually progress the diet as tolerated.  Avoid fried, spicy or processed foods as these can irritate the GI system.  Follow-up with your primary care provider for any continued symptoms.  Return to clinic for any new or concerning symptoms.

## 2023-11-12 NOTE — ED Triage Notes (Signed)
 PT reports HA and ABD pain for 2 days. Pt's Daughter is getting over Hand/Foot/Mouth. PT tried OTC for HA with out relief.
